# Patient Record
Sex: Male | Born: 1940 | Race: White | Hispanic: No | Smoking: Never smoker
Health system: Southern US, Community
[De-identification: ages and names within clinical notes are randomized; demographics above are authoritative.]

## PROBLEM LIST (undated history)

## (undated) DIAGNOSIS — R519 Headache, unspecified: Secondary | ICD-10-CM

## (undated) DIAGNOSIS — K219 Gastro-esophageal reflux disease without esophagitis: Secondary | ICD-10-CM

## (undated) DIAGNOSIS — D649 Anemia, unspecified: Secondary | ICD-10-CM

## (undated) DIAGNOSIS — I059 Rheumatic mitral valve disease, unspecified: Secondary | ICD-10-CM

## (undated) DIAGNOSIS — F0390 Unspecified dementia without behavioral disturbance: Secondary | ICD-10-CM

## (undated) DIAGNOSIS — I330 Acute and subacute infective endocarditis: Secondary | ICD-10-CM

## (undated) DIAGNOSIS — I514 Myocarditis, unspecified: Secondary | ICD-10-CM

## (undated) DIAGNOSIS — C4331 Malignant melanoma of nose: Secondary | ICD-10-CM

## (undated) DIAGNOSIS — Z982 Presence of cerebrospinal fluid drainage device: Secondary | ICD-10-CM

## (undated) DIAGNOSIS — I10 Essential (primary) hypertension: Secondary | ICD-10-CM

## (undated) DIAGNOSIS — G40909 Epilepsy, unspecified, not intractable, without status epilepticus: Secondary | ICD-10-CM

## (undated) DIAGNOSIS — R51 Headache: Secondary | ICD-10-CM

## (undated) DIAGNOSIS — M1990 Unspecified osteoarthritis, unspecified site: Secondary | ICD-10-CM

## (undated) DIAGNOSIS — S0291XA Unspecified fracture of skull, initial encounter for closed fracture: Secondary | ICD-10-CM

## (undated) DIAGNOSIS — E785 Hyperlipidemia, unspecified: Secondary | ICD-10-CM

## (undated) DIAGNOSIS — D332 Benign neoplasm of brain, unspecified: Secondary | ICD-10-CM

## (undated) HISTORY — PX: MELANOMA EXCISION: SHX5266

## (undated) HISTORY — DX: Unspecified dementia, unspecified severity, without behavioral disturbance, psychotic disturbance, mood disturbance, and anxiety: F03.90

## (undated) HISTORY — DX: Benign neoplasm of brain, unspecified: D33.2

## (undated) HISTORY — DX: Myocarditis, unspecified: I51.4

## (undated) HISTORY — DX: Anemia, unspecified: D64.9

## (undated) HISTORY — DX: Epilepsy, unspecified, not intractable, without status epilepticus: G40.909

## (undated) HISTORY — PX: BUNIONECTOMY: SHX129

## (undated) HISTORY — DX: Hyperlipidemia, unspecified: E78.5

## (undated) HISTORY — DX: Unspecified fracture of skull, initial encounter for closed fracture: S02.91XA

## (undated) HISTORY — PX: EYE SURGERY: SHX253

## (undated) HISTORY — PX: CARDIAC CATHETERIZATION: SHX172

## (undated) HISTORY — DX: Essential (primary) hypertension: I10

## (undated) HISTORY — DX: Gastro-esophageal reflux disease without esophagitis: K21.9

## (undated) HISTORY — DX: Acute and subacute infective endocarditis: I33.0

---
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
## 2006-04-08 ENCOUNTER — Other Ambulatory Visit: Payer: Self-pay

## 2006-04-08 ENCOUNTER — Inpatient Hospital Stay: Payer: Self-pay | Admitting: Internal Medicine

## 2007-03-22 ENCOUNTER — Other Ambulatory Visit: Payer: Self-pay

## 2007-03-22 ENCOUNTER — Ambulatory Visit: Payer: Self-pay | Admitting: Ophthalmology

## 2007-03-29 ENCOUNTER — Ambulatory Visit: Payer: Self-pay | Admitting: Ophthalmology

## 2013-08-14 ENCOUNTER — Ambulatory Visit: Payer: Self-pay | Admitting: Podiatry

## 2013-09-06 ENCOUNTER — Ambulatory Visit (INDEPENDENT_AMBULATORY_CARE_PROVIDER_SITE_OTHER): Payer: Medicare Other | Admitting: Podiatry

## 2013-09-06 ENCOUNTER — Encounter: Payer: Self-pay | Admitting: Podiatry

## 2013-09-06 VITALS — BP 114/65 | HR 73 | Resp 14 | Ht 63.0 in | Wt 118.0 lb

## 2013-09-06 DIAGNOSIS — M79609 Pain in unspecified limb: Secondary | ICD-10-CM

## 2013-09-06 DIAGNOSIS — B351 Tinea unguium: Secondary | ICD-10-CM

## 2013-09-06 NOTE — Progress Notes (Signed)
   Subjective:    Patient ID: Nicholas Hodges, male    DOB: 1940/11/20, 73 y.o.   MRN: [id number]  HPI Comments: He has a toenail that has grown over his other toes, right great toenail long thick and painful      Review of Systems  Constitutional: Positive for fatigue and unexpected weight change.  HENT:       Ringing in ears , sinus problems  Sneezing   Endocrine: Positive for cold intolerance.  Musculoskeletal:       Muscle pain   Allergic/Immunologic: Positive for food allergies.  Neurological: Positive for seizures and headaches.  Psychiatric/Behavioral: Positive for confusion.  All other systems reviewed and are negative.       Objective:   Physical Exam: I have reviewed his past medical history medications allergies surgeries social history. Pulses are strongly palpable bilateral neurologic sensorium is intact. Orthopedic evaluation Mr. is all joints distal to the ankle a full range of motion without crepitus mild HAV deformity and hammertoe deformities are noted bilateral but are asymptomatic. Cutaneous evaluation demonstrates supple well hydrated cutis nails are grossly neglected elongated and painful. He lost the nail to his fourth toe his right foot recently. There no signs of bacterial infection at this point.          Assessment & Plan:  Assessment: Pain in limb secondary to onychomycosis 1 through 5 bilateral.  Plan: Debridement of nails 1 through 5 bilateral covered service secondary to pain.

## 2015-01-09 ENCOUNTER — Ambulatory Visit: Payer: Medicare Other

## 2015-04-22 ENCOUNTER — Encounter: Payer: Self-pay | Admitting: Podiatry

## 2015-04-22 ENCOUNTER — Ambulatory Visit (INDEPENDENT_AMBULATORY_CARE_PROVIDER_SITE_OTHER): Payer: Medicare Other | Admitting: Podiatry

## 2015-04-22 VITALS — BP 140/76 | HR 67 | Resp 16

## 2015-04-22 DIAGNOSIS — M79676 Pain in unspecified toe(s): Secondary | ICD-10-CM | POA: Diagnosis not present

## 2015-04-22 DIAGNOSIS — B351 Tinea unguium: Secondary | ICD-10-CM | POA: Diagnosis not present

## 2015-04-22 NOTE — Progress Notes (Signed)
He presents today with chief complaint of painful elongated toenails bilateral. He states there is no longer hurt and I think about an ingrown toenail.  Objective: Vital signs are stable he is alert and oriented 3 pulses are palpable bilateral. His nails are grossly elongated with malodor no purulence and no signs of infection.  Assessment: Pain and limb secondary to onychomycosis 1 through 5 bilateral.  Plan: Debridement of nails 1 through 5 bilateral as a covered service secondary to pain. Follow-up with me in 3 months.  Roselind Messier DPM

## 2015-07-22 ENCOUNTER — Encounter (INDEPENDENT_AMBULATORY_CARE_PROVIDER_SITE_OTHER): Payer: Medicare Other | Admitting: Podiatry

## 2015-07-22 ENCOUNTER — Ambulatory Visit: Payer: Medicare Other

## 2015-07-22 ENCOUNTER — Encounter: Payer: Self-pay | Admitting: Podiatry

## 2015-07-22 DIAGNOSIS — M79676 Pain in unspecified toe(s): Secondary | ICD-10-CM

## 2015-07-22 DIAGNOSIS — B351 Tinea unguium: Secondary | ICD-10-CM

## 2015-07-22 NOTE — Progress Notes (Signed)
This encounter was created in error - please disregard.

## 2015-08-07 ENCOUNTER — Ambulatory Visit: Payer: Medicare Other | Admitting: Podiatry

## 2017-03-10 ENCOUNTER — Ambulatory Visit: Payer: Medicare Other

## 2017-03-10 ENCOUNTER — Ambulatory Visit (INDEPENDENT_AMBULATORY_CARE_PROVIDER_SITE_OTHER): Payer: Medicare Other | Admitting: Podiatry

## 2017-03-10 ENCOUNTER — Encounter: Payer: Self-pay | Admitting: Podiatry

## 2017-03-10 DIAGNOSIS — M79676 Pain in unspecified toe(s): Secondary | ICD-10-CM | POA: Diagnosis not present

## 2017-03-10 DIAGNOSIS — B351 Tinea unguium: Secondary | ICD-10-CM

## 2017-03-10 DIAGNOSIS — M109 Gout, unspecified: Secondary | ICD-10-CM

## 2017-03-10 DIAGNOSIS — S90212A Contusion of left great toe with damage to nail, initial encounter: Secondary | ICD-10-CM

## 2017-03-10 NOTE — Progress Notes (Signed)
He presents today with his daughter and grandson chief concern of a painful hallux nail right foot. Daughter states that this is been this way for quite some time seems to be getting worse his blood underneath it and he complains about hurting. 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  Objective: Pulses are palpable. Subungual hematoma hallux right with distal onycholysis. Does not appear to be infected. No erythema cellulitis drainage or odor.  Assessment: Paronychia subungual hematoma.  Plan: I debrided the nail for him today cleaned of all of the debris recommended Epsom salts soaks at least once a day for the next week this should resolve his symptoms.

## 2017-04-04 ENCOUNTER — Emergency Department (HOSPITAL_COMMUNITY)
Admission: EM | Admit: 2017-04-04 | Discharge: 2017-04-05 | Disposition: A | Discharge status: Home / Self Care | Payer: Medicare Other | Attending: Emergency Medicine | Admitting: Emergency Medicine

## 2017-04-04 ENCOUNTER — Emergency Department (HOSPITAL_COMMUNITY): Payer: Medicare Other

## 2017-04-04 ENCOUNTER — Encounter (HOSPITAL_COMMUNITY): Payer: Self-pay | Admitting: Emergency Medicine

## 2017-04-04 DIAGNOSIS — G309 Alzheimer's disease, unspecified: Secondary | ICD-10-CM | POA: Insufficient documentation

## 2017-04-04 DIAGNOSIS — Z79899 Other long term (current) drug therapy: Secondary | ICD-10-CM | POA: Diagnosis not present

## 2017-04-04 DIAGNOSIS — R4781 Slurred speech: Secondary | ICD-10-CM | POA: Diagnosis not present

## 2017-04-04 DIAGNOSIS — I10 Essential (primary) hypertension: Secondary | ICD-10-CM | POA: Insufficient documentation

## 2017-04-04 HISTORY — DX: Presence of cerebrospinal fluid drainage device: Z98.2

## 2017-04-04 LAB — CBC
HEMATOCRIT: 43.5 % (ref 39.0–52.0)
HEMOGLOBIN: 14.9 g/dL (ref 13.0–17.0)
MCH: 32.7 pg (ref 26.0–34.0)
MCHC: 34.3 g/dL (ref 30.0–36.0)
MCV: 95.4 fL (ref 78.0–100.0)
Platelets: 207 10*3/uL (ref 150–400)
RBC: 4.56 MIL/uL (ref 4.22–5.81)
RDW: 14.8 % (ref 11.5–15.5)
WBC: 8.6 10*3/uL (ref 4.0–10.5)

## 2017-04-04 LAB — BASIC METABOLIC PANEL
Anion gap: 6 (ref 5–15)
BUN: 15 mg/dL (ref 6–20)
CHLORIDE: 103 mmol/L (ref 101–111)
CO2: 23 mmol/L (ref 22–32)
Calcium: 8.4 mg/dL — ABNORMAL LOW (ref 8.9–10.3)
Creatinine, Ser: 0.82 mg/dL (ref 0.61–1.24)
GFR calc Af Amer: >60 mL/min (ref >60)
GFR calc non Af Amer: >60 mL/min (ref >60)
Glucose, Bld: 90 mg/dL (ref 65–99)
POTASSIUM: 4.2 mmol/L (ref 3.5–5.1)
SODIUM: 132 mmol/L — AB (ref 135–145)

## 2017-04-04 LAB — CBG MONITORING, ED: Glucose-Capillary: 85 mg/dL (ref 65–99)

## 2017-04-04 NOTE — ED Notes (Signed)
Family gave pt 100MG  Dilantin.

## 2017-04-04 NOTE — ED Notes (Signed)
Pt to radiology via stretcher on monitor

## 2017-04-04 NOTE — ED Notes (Signed)
Pt to CT via stretcher

## 2017-04-04 NOTE — ED Notes (Signed)
Pt returned from xray

## 2017-04-04 NOTE — ED Notes (Signed)
Pt returned from CT °

## 2017-04-04 NOTE — ED Triage Notes (Signed)
Pt transported from home by EMS for possible CVA, per family pt may have some increase in slurred speech but unsure of when change occurred.  Pt was at baseline for EMS, per family pt felt less tickleish on right side than normal. Per family pt may have reported some numbness on right foot @ 1830 Family reports urine may be darker than normal. Per EMS pt is total dependant but no WC or walker in home. Pt is alert to norm, afebrile, responds to stimuli per norm.

## 2017-04-04 NOTE — ED Provider Notes (Signed)
[facility] DEPT Provider Note   CSN: [id number] Arrival date & time: 04/04/17  2149     History   Chief Complaint Chief Complaint  Patient presents with  . Weakness    HPI Nicholas Hodges is a 76 y.o. male with h/o epilepsy, brain tumor s/p resection, Alzheimer's (reported by daughter) is brought to ED for evaluation of slurred speech, numbness to bilateral soles, numbness to left leg and disorientation.  Symptoms first noticed at [facility] by daughter. Daughter first noticed pt was first disoriented (could not remember her name), then he couldn't feel either of his soles and usually he is very ticklish whenever his feet are touch. He could not feel his left leg when getting a bath. Symptoms completed resolved by arrival to ED.   Lives with wife and daughter's family. Has 24/7 care. No recent falls, fevers, vomiting, diarrhea, changes in appetite or urine output. Has had wet sounding cough x couple of days. No h/o CVA or TIA.   HPI  Past Medical History:  Diagnosis Date  . Brain tumor ([place])   . Epilepsy ([place])   . GERD (gastroesophageal reflux disease)   . Hyperlipidemia   . Hypertension   . Myocarditis ([place])   . S/P VP shunt   . Subacute bacterial endocarditis     There are no active problems to display for this patient.   Past Surgical History:  Procedure Laterality Date  . BRAIN TUMOR EXCISION    . foot surgery         Home Medications    Prior to Admission medications   Medication Sig Start Date End Date Taking? Authorizing Provider  DILANTIN 100 MG ER capsule Take 100 mg by mouth 3 (three) times daily.  07/20/13  Yes [provider]    Family History No family history on file.  Social History Social History  Substance Use Topics  . Smoking status: Never Smoker  . Smokeless tobacco: Never Used  . Alcohol use No     Allergies   Patient has no known allergies.   Review of Systems Review of Systems  Unable to perform ROS: Dementia    Constitutional: Negative for fever.  Genitourinary: Negative for difficulty urinating.     Physical Exam Updated Vital Signs BP 94/61   Pulse 64   Temp 97.6 F (36.4 C) (Oral)   Resp 11   SpO2 97%   Physical Exam  Constitutional: He is oriented to person, place, and time. He appears well-developed and well-nourished. No distress.  Pleasantly confused, alert and oriented to self only. Recognizes her daughter.  HENT:  Head: Normocephalic and atraumatic.  Nose: Nose normal.  Mouth/Throat: Oropharynx is clear and moist. No oropharyngeal exudate.  Poor dentition Oropharynx and tonsils normal  Eyes: Pupils are equal, round, and reactive to light. Conjunctivae and EOM are normal.  Neck: Normal range of motion. Neck supple. No tracheal deviation present.  Cardiovascular: Normal rate, regular rhythm, normal heart sounds and intact distal pulses.   No murmur heard. Pulmonary/Chest: Effort normal and breath sounds normal. No respiratory distress. He has no wheezes. He has no rales.  Abdominal: Soft. Bowel sounds are normal. He exhibits no distension. There is no tenderness.  Musculoskeletal: Normal range of motion. He exhibits no deformity.  Neurological: He is alert and oriented to person, place, and time.  PERRL bilaterally, cannot follow command to check EOMs 5/5 strength bilateral grip 5/5 strength bilateral hip flexion Sensation to light touch intact to hand, legs and soles  No leg drift   Skin: Skin is warm and dry. Capillary refill takes less than 2 seconds.  Psychiatric: He has a normal mood and affect. His behavior is normal. Judgment and thought content normal.  Nursing note and vitals reviewed.    ED Treatments / Results  Labs (all labs ordered are listed, but only abnormal results are displayed) Labs Reviewed  BASIC METABOLIC PANEL - Abnormal; Notable for the following:       Result Value   Sodium 132 (*)    Calcium 8.4 (*)    All other components within normal  limits  PHENYTOIN LEVEL, TOTAL - Abnormal; Notable for the following:    Phenytoin Lvl 32.0 (*)    All other components within normal limits  CBC  URINALYSIS, ROUTINE W REFLEX MICROSCOPIC  CBG MONITORING, ED    EKG  EKG Interpretation  Date/Time:  Sunday April 04 2017 22:03:57 EDT Ventricular Rate:  66 PR Interval:    QRS Duration: 96 QT Interval:  441 QTC Calculation: 463 R Axis:   83 Text Interpretation:  Sinus rhythm Probable left atrial enlargement Borderline right axis deviation no significant change compared to 2008 Confirmed by Sherwood Gambler [phone]) on 04/04/2017 10:14:47 PM       Radiology Dg Chest 2 View  Result Date: 04/04/2017 CLINICAL DATA:  Cough EXAM: CHEST  2 VIEW COMPARISON:  None. FINDINGS: The heart size and mediastinal contours are within normal limits. Both lungs are clear. The visualized skeletal structures are unremarkable. IMPRESSION: No active cardiopulmonary disease. Electronically Signed   By: Ulyses Jarred M.D.   On: 04/04/2017 23:54   Ct Head Wo Contrast  Result Date: 04/04/2017 CLINICAL DATA:  Altered mental status EXAM: CT HEAD WITHOUT CONTRAST TECHNIQUE: Contiguous axial images were obtained from the base of the skull through the vertex without intravenous contrast. COMPARISON:  None. FINDINGS: Brain: There is a right frontal approach ventriculostomy catheter with tip in the temporal horn of the right lateral ventricle. There is severe atrophy with lateral ventriculomegaly. There is cerebellar encephalomalacia and dilated extra-axial space of the posterior fossa. No acute hemorrhage. No midline shift or other mass effect. Vascular: No hyperdense vessel or unexpected calcification. Skull: Remote suboccipital craniectomy. Sinuses/Orbits: No sinus fluid levels or advanced mucosal thickening. No mastoid effusion. Normal orbits. IMPRESSION: 1. Findings of remote posterior fossa tumor resection with severe cerebellar encephalomalacia. 2. Severe atrophy with  marked ventriculomegaly. Right frontal approach ventriculostomy shunt catheter terminates in the temporal horn of the right lateral ventricle. 3. No acute abnormality. Electronically Signed   By: Ulyses Jarred M.D.   On: 04/04/2017 23:25    Procedures Procedures (including critical care time)  Medications Ordered in ED Medications - No data to display   Initial Impression / Assessment and Plan / ED Course  I have reviewed the triage vital signs and the nursing notes.  Pertinent labs & imaging results that were available during my care of the patient were reviewed by me and considered in my medical decision making (see chart for details).  Clinical Course as of Apr 05 240  Mon Apr 05, 2017  0048 Spoke to Dr. Cheral Marker. He discussed many possible ddx. Dementia w Cognitive decline? Seizure? Drug toxicity? Infection? shunt malfunction? Symptoms don't sound convincing of TIA/CVA. Discussed risks vs benefits of admission for full work up or outpatient work up. Family felt strongly about doing outpatient work up with neurology.   [CG]  0204 Phenytoin Lvl: (!!) 32.0 [CG]    Clinical Course User  Index [CG] Kinnie Feil, PA-C   76 yo male with baseline dementia, VP shunt, seizures on dilantin, brain tumor s/p resection presents to ED for evaluation of disorientation, slurred speech, decreased sensation to bilateral soles and LLE onset 6PM. Symptoms have resolved. On exam, he is pleasantly confused and oriented to self. He recognizes his daughter. Sensation to light touch intact in soles, hands and legs intact. No focal weakness. Considering TIA vs CVA, infection, drug toxicity.   CBC, BMP, U/A, CBG, EKG and CXR unremarkable. Phenytoin level 32. Pt used to see neurology many years ago. Does not have a PCP. Has been on 300 mg dilantin TID for many years without recent checks or dose adjustments.   Discussed admission vs outpatient f/u with family. Discussed risks and benefits of each option with  family at bedside. They opted to be discharged today and complete work up as outpatient, they are aware of possible risks with this including life altering stroke and death. He does not sound like a good candidate for anticoagulation.  Discussed pt with Dr Cheral Marker. He is considering other etiologies, s/s not super convincing of TIA/CVA. He finds outpatient work up reasonable given pt's baseline mental status.  Patient, ED treatment and discharge plan was discussed with supervising physician who also evaluated the patient and is agreeable with plan.   Pt d/c with urgent referral to neuro ordered. Advised to hold dilantin for 2 days and get levels checked again then. Seizure precautions given. He does not have PCP or neurologist, he is aware if he is unable to establish care as outpatient or get levels checked he can return to ED,   Final Clinical Impressions(s) / ED Diagnoses   Final diagnoses:  Slurred speech    New Prescriptions New Prescriptions   No medications on file     Kinnie Feil, PA-C 04/05/17 0241    Kinnie Feil, PA-C 04/08/17 0175    Merryl Hacker, MD 04/11/17 2255

## 2017-04-05 DIAGNOSIS — R4781 Slurred speech: Secondary | ICD-10-CM | POA: Diagnosis not present

## 2017-04-05 LAB — URINALYSIS, ROUTINE W REFLEX MICROSCOPIC
Bilirubin Urine: NEGATIVE
GLUCOSE, UA: NEGATIVE mg/dL
HGB URINE DIPSTICK: NEGATIVE
KETONES UR: NEGATIVE mg/dL
Leukocytes, UA: NEGATIVE
Nitrite: NEGATIVE
PH: 6 (ref 5.0–8.0)
PROTEIN: NEGATIVE mg/dL
Specific Gravity, Urine: 1.018 (ref 1.005–1.030)

## 2017-04-05 LAB — PHENYTOIN LEVEL, TOTAL: Phenytoin Lvl: 32 ug/mL (ref 10.0–20.0)

## 2017-04-05 NOTE — ED Notes (Signed)
PA at bedside.

## 2017-04-05 NOTE — ED Notes (Signed)
Placed condom cath on pt 

## 2017-04-05 NOTE — Discharge Instructions (Signed)
Your dilantin levels are high. Please hold your dilantin for 2 days. You need to get your levels checked within two days maximum and your dose modified. If you cannot get your levels rechecked in 2 days by neurology or primary care provider return to emergency department so we can check your levels and re-start your dose. Stopping your dilantin medication for more than 2 days can predispose you to have a seizure , falls and more serious complications.  Neurology urgent referral has been placed. Contact neurology as soon as possible for further evaluation and full outpatient work up of your symptoms .

## 2017-04-06 ENCOUNTER — Other Ambulatory Visit: Payer: Medicare Other

## 2017-04-06 ENCOUNTER — Encounter: Payer: Self-pay | Admitting: Neurology

## 2017-04-06 ENCOUNTER — Ambulatory Visit (INDEPENDENT_AMBULATORY_CARE_PROVIDER_SITE_OTHER): Payer: Medicare Other | Admitting: Neurology

## 2017-04-06 VITALS — BP 126/68 | HR 78 | Ht 62.0 in | Wt 123.0 lb

## 2017-04-06 DIAGNOSIS — Z87898 Personal history of other specified conditions: Secondary | ICD-10-CM | POA: Diagnosis not present

## 2017-04-06 DIAGNOSIS — G40209 Localization-related (focal) (partial) symptomatic epilepsy and epileptic syndromes with complex partial seizures, not intractable, without status epilepticus: Secondary | ICD-10-CM

## 2017-04-06 DIAGNOSIS — F0390 Unspecified dementia without behavioral disturbance: Secondary | ICD-10-CM

## 2017-04-06 DIAGNOSIS — F03A0 Unspecified dementia, mild, without behavioral disturbance, psychotic disturbance, mood disturbance, and anxiety: Secondary | ICD-10-CM

## 2017-04-06 LAB — PHENYTOIN LEVEL, TOTAL: PHENYTOIN LVL: 23.5 mg/L — AB (ref 10.0–20.0)

## 2017-04-06 NOTE — Progress Notes (Signed)
NEUROLOGY CONSULTATION NOTE  Nicholas Hodges MRN: [id number] DOB: 15-Apr-1941  Referring provider: Dr. Thayer Jew (ER) Primary care provider: Threasa Alpha, FNP  Reason for consult:  Episode of slurred speech  Dear Dr Dina Rich:  Thank you for your kind referral of Nicholas Hodges for consultation of the above symptoms. Although his history is well known to you, please allow me to reiterate it for the purpose of our medical record. The patient was accompanied to the clinic by his wife and daughter who also provide collateral information. Records and images were personally reviewed where available.  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 He is total care at baseline, family helps him with dressing and bathing, and started noticing that he would be leaning forward and was more wobbly recently. His daughter bought him new shoes, and when she tried to put it on last 8/26, noticed that he was not reacting when his feet were touched, he would usually be very ticklish. His son-in-law put him in the tub for a bath, and he reported that he could not feel patchy areas on his left leg. Family also noticed his speech was slurred and he was more disoriented (could not remember her name). He was brought to the ER, and daughter reports by that time, he was back to baseline, not wanting staff to touch his feet. Family stays with him 24/7 and had not witnessed any seizure activity. In the ER, he was noted to have a supratherapeutic Dilantin level of 32. Records in the ER indicate he was on 300mg  TID, but his wife and daughter today confirm repeatedly that he has been on 100mg  TID Dilantin for many years with no change in dose, no new  medications added. His wife gives his medications. CBC, BMP were unremarkable except for mildly low sodium of 132. Urinalysis negative. I personally reviewed head CT without contrast which did not show any acute changes, there was severe atrophy with lateral ventriculomegaly, right frontal approach ventriculostomy catheter with tip in the temporal horn of the right lateral ventricle. 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 Family was instructed to hold Dilantin until Neurology follow-up.  His family reports he has been doing well since hospital discharge, with no further slurred speech. There is some delay in answering questions, but he appears to be able to answer appropriately when asked about dizziness ("some, not all the time"). His vision is "sometimes blurry, sometimes clear," and states it is blurry today. He denies any neck/back pain, no paresthesias. His wife reports that he continues to have seizures every 2 months or so, he would start staring, get stiff with twitching, with head turn to the left. Seizures last 1-2 minutes, he is drowsy after. He wears adult diapers but family reports he has incontinence with the seizures. This year he has had around 4 seizures. His wife denies any other prior AEDs. They started noticing memory changes for the past several years, he would forget little things, or put shoes on the wrong foot. Family now puts on his clothes and bathes him. He usually repeats himself. They report sleep and appetite are good. His older sister had dementia.   I personally reviewed  head CT without contrast done 04/04/17, no acute changes seen. There was a right frontal approach ventriculostomy catheter with tip in the temporal horn of the right lateral ventricle. There was severe atrophy with lateral ventriculomegaly, cerebellar encephalomalacia and dilated extra-axial space of the posterior fossa.    PAST MEDICAL  HISTORY: Past Medical History:  Diagnosis Date  . Brain tumor ([place])   . Epilepsy ([place])   . GERD (gastroesophageal reflux disease)   . Hyperlipidemia   . Hypertension   . Myocarditis ([place])   . S/P VP shunt   . Subacute bacterial endocarditis     PAST SURGICAL HISTORY: Past Surgical History:  Procedure Laterality Date  . BRAIN TUMOR EXCISION    . foot surgery      MEDICATIONS: Current Outpatient Prescriptions on File Prior to Visit  Medication Sig Dispense Refill  . DILANTIN 100 MG ER capsule Take 100 mg by mouth 3 (three) times daily.      No current facility-administered medications on file prior to visit.     ALLERGIES: No Known Allergies  FAMILY HISTORY: Family History  Problem Relation Age of Onset  . Aneurysm Mother   . Dementia Sister   . Stomach cancer Sister   . Colon cancer Sister     SOCIAL HISTORY: Social History   Social History  . Marital status: Married    Spouse name: N/A  . Number of children: N/A  . Years of education: N/A   Occupational History  . Not on file.   Social History Main Topics  . Smoking status: Never Smoker  . Smokeless tobacco: Never Used  . Alcohol use No  . Drug use: No  . Sexual activity: Not on file   Other Topics Concern  . Not on file   Social History Narrative  . No narrative on file    REVIEW OF SYSTEMS: Constitutional: No fevers, chills, or sweats, no generalized fatigue, change in appetite Eyes: No visual changes, double vision, eye pain Ear, nose and throat: No hearing loss, ear pain, nasal congestion, sore throat Cardiovascular: No chest pain, palpitations Respiratory:  No shortness of breath at rest or with exertion, wheezes GastrointestinaI: No nausea, vomiting, diarrhea, abdominal pain, fecal incontinence Genitourinary:  No dysuria, urinary retention or frequency Musculoskeletal:  No neck pain, back pain Integumentary: No rash, pruritus, skin lesions Neurological: as above Psychiatric: No  depression, insomnia, anxiety Endocrine: No palpitations, fatigue, diaphoresis, mood swings, change in appetite, change in weight, increased thirst Hematologic/Lymphatic:  No anemia, purpura, petechiae. Allergic/Immunologic: no itchy/runny eyes, nasal congestion, recent allergic reactions, rashes  PHYSICAL EXAM: Vitals:   04/06/17 0920  BP: 126/68  Pulse: 78  SpO2: 93%   General: No acute distress, wheelchair-bound Head:  Normocephalic Eyes: Fundoscopic exam shows bilateral sharp discs, no vessel changes, exudates, or hemorrhages Neck: supple, no paraspinal tenderness, full range of motion Back: No paraspinal tenderness Heart: regular rate and rhythm Lungs: Clear to auscultation bilaterally. Vascular: No carotid bruits. Skin/Extremities: No rash, no edema Neurological Exam: Mental status: alert and oriented to person, place, mild dysarthria, no aphasia, Fund of knowledge is reduced.  Recent and remote memory are impaired.  Attention and concentration are reduced, unable to spell WORLD. Unable to draw intersecting pentagons or clock (drew circle and wrote number 6). Able to name objects and repeat phrases.  Cranial nerves: CN I: not tested CN II: pupils equal, round and reactive to light, visual fields intact, fundi unremarkable. CN III, IV, VI:  full range of motion, no  nystagmus, no ptosis CN V: facial sensation intact CN VII: upper and lower face symmetric CN VIII: hearing intact to finger rub CN IX, X: gag intact, uvula midline CN XI: sternocleidomastoid and trapezius muscles intact CN XII: tongue midline Bulk & Tone: normal, no fasciculations. Motor: 5/5 throughout with no pronator drift. Sensation: intact to light touch, cold, pin, vibration and joint position sense.  No extinction to double simultaneous stimulation. Deep Tendon Reflexes: +2 throughout, no ankle clonus Plantar responses: downgoing bilaterally Cerebellar: mild left ataxia on finger to nose testing.  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 He has been on Dilantin 100mg  TID for many years with no recent changes in medications, his wife administers medications. A repeat Dilantin level will be checked today. Depending on level, we may either continue to hold or restart on a lower dose. He was still having seizures every few months on Dilantin, we discussed starting a new seizure medication, Lamotrigine, with future plans to very slowly taper off Dilantin once Lamotrigine is therapeutic. Side effects of Lamotrigine, including Kathreen Cosier syndrome, were discussed. Routine EEG will be ordered. His family is also concerned about dementia, he does have mild to moderate dementia, would hold off on starting cholinesterase inhibitors such as Donepezil at this time, we agreed to start one medication at a time. He will follow-up in 3 months and family knows to call for any changes.   Thank you for allowing me to participate in the care of this patient. Please do not hesitate to call for any questions or concerns.   Ellouise Newer, M.D.  CC: Threasa Alpha, FNP

## 2017-04-06 NOTE — Patient Instructions (Addendum)
1. Bloodwork for stat Dilantin level today  Your provider has requested that you have labwork completed today. Please go to [facility] ([phone]) on the second floor of this building before leaving the office today. You do not need to check in. If you are not called within 15 minutes please check with the front desk.   2. Schedule routine EEG 3. Our office will call you today with Dilantin results and instructions for medications 4. Follow-up in 3 months, call for any changes  Seizure Precautions: 1. If medication has been prescribed for you to prevent seizures, take it exactly as directed.  Do not stop taking the medicine without talking to your doctor first, even if you have not had a seizure in a long time.   2. Avoid activities in which a seizure would cause danger to yourself or to others.  Don't operate dangerous machinery, swim alone, or climb in high or dangerous places, such as on ladders, roofs, or girders.  Do not drive unless your doctor says you may.  3. If you have any warning that you may have a seizure, lay down in a safe place where you can't hurt yourself.    4.  No driving for 6 months from last seizure, as per [facility].   Please refer to the following link on the [place] website for more information: http://www.epilepsyfoundation.org/answerplace/Social/driving/drivingu.cfm   5.  Maintain good sleep hygiene. Avoid alcohol.  6.  Contact your doctor if you have any problems that may be related to the medicine you are taking.  7.  Call 911 and bring the patient back to the ED if:        A.  The seizure lasts longer than 5 minutes.       B.  The patient doesn't awaken shortly after the seizure  C.  The patient has new problems such as difficulty seeing, speaking or moving  D.  The patient was injured during the seizure  E.  The patient has a temperature over 102 F (39C)  F.  The patient vomited and now is having trouble  breathing

## 2017-04-07 ENCOUNTER — Other Ambulatory Visit: Payer: Self-pay | Admitting: Neurology

## 2017-04-07 ENCOUNTER — Other Ambulatory Visit: Payer: Self-pay

## 2017-04-07 ENCOUNTER — Telehealth: Payer: Self-pay | Admitting: Neurology

## 2017-04-07 DIAGNOSIS — R479 Unspecified speech disturbances: Secondary | ICD-10-CM

## 2017-04-07 DIAGNOSIS — G40219 Localization-related (focal) (partial) symptomatic epilepsy and epileptic syndromes with complex partial seizures, intractable, without status epilepticus: Secondary | ICD-10-CM

## 2017-04-07 DIAGNOSIS — G40209 Localization-related (focal) (partial) symptomatic epilepsy and epileptic syndromes with complex partial seizures, not intractable, without status epilepticus: Secondary | ICD-10-CM

## 2017-04-07 MED ORDER — LAMOTRIGINE 25 MG PO TABS: ORAL_TABLET | ORAL | 6 refills | Status: DC

## 2017-04-07 MED ORDER — PHENYTOIN SODIUM EXTENDED 100 MG PO CAPS: ORAL_CAPSULE | ORAL | 6 refills | Status: DC

## 2017-04-07 NOTE — Telephone Encounter (Signed)
Received results of STAT Dilantin level on 8/28 at 10:16PM (lab was ordered at [facility] on 8/28). Results were discussed with his daughter, Dilantin level has come down from 32 to 23, restart Dilantin tonight at lower dose 100mg  BID. Start Lamotrigine 25mg  BID x 2 weeks, then increase to 50mg  BID. Side effects, including Kathreen Cosier syndrome were discussed. F/u in 3 months as scheduled, call for any changes.   Meagen, let's order another Dilantin level for Friday 8/30, they have a [facility] in [place] per daughter. Thanks

## 2017-04-07 NOTE — Addendum Note (Signed)
Addended by: Lenny Pastel on: 04/07/2017 04:24 PM   Modules accepted: Orders

## 2017-04-08 ENCOUNTER — Telehealth: Payer: Self-pay | Admitting: Neurology

## 2017-04-08 ENCOUNTER — Ambulatory Visit (INDEPENDENT_AMBULATORY_CARE_PROVIDER_SITE_OTHER): Payer: Medicare Other | Admitting: Neurology

## 2017-04-08 DIAGNOSIS — Z87898 Personal history of other specified conditions: Secondary | ICD-10-CM

## 2017-04-08 DIAGNOSIS — G40209 Localization-related (focal) (partial) symptomatic epilepsy and epileptic syndromes with complex partial seizures, not intractable, without status epilepticus: Secondary | ICD-10-CM | POA: Diagnosis not present

## 2017-04-08 NOTE — Telephone Encounter (Signed)
Office # [phone]

## 2017-04-08 NOTE — Telephone Encounter (Signed)
Patient was seen in the office today for an EEG. He also was

## 2017-04-08 NOTE — Telephone Encounter (Signed)
Patient was in the office today for an EEG. He also was seen on 04/06/17 Tuesday. He had labs taken by [facility]. His daughter wanted to know could that blood work also be sent to [facility] Dr. Threasa Alpha? They were unable to draw blood from him when he was recently in their office. Please Advise. Thanks

## 2017-04-09 NOTE — Telephone Encounter (Signed)
Notes faxed via EPIC  

## 2017-04-09 NOTE — Procedures (Signed)
ELECTROENCEPHALOGRAM REPORT  Date of Study: 04/08/2017  Patient's Name: Nicholas Hodges MRN: [id number] Date of Birth: 1941/01/05  Referring Provider: Dr. Ellouise Newer  Clinical History: This is a 76 year old man with history of brain tumor s/p VP shunt, seizures. EEG for classification.  Medications: Dilantin  Technical Summary: A multichannel digital EEG recording measured by the international 10-20 system with electrodes applied with paste and impedances below 5000 ohms performed as portable with EKG monitoring in an awake and asleep patient.  Hyperventilation was not performed. Photic stimulation was performed.  The digital EEG was referentially recorded, reformatted, and digitally filtered in a variety of bipolar and referential montages for optimal display.   Description: The patient is awake and asleep during the recording.  During maximal wakefulness, there is a symmetric, medium voltage 7 Hz posterior dominant rhythm that attenuates with eye opening. This is admixed with a moderate amount of diffuse 4-5 Hz theta and occasional diffuse 2-3 Hz delta slowing of the waking background. There is additional frequent focal 2 Hz delta slowing seen over the right centroparietal region. During drowsiness and sleep, there is an increase in theta and delta slowing of the background. Normal sleep architecture is not seen. Photic stimulation did not elicit any abnormalities.  There were no epileptiform discharges or electrographic seizures seen.    EKG lead was unremarkable.  Impression: This  awake and asleep EEG is abnormal due to the presence of: 1. Mild to moderate diffuse slowing of the waking background 2. Additional focal slowing over the right centroparietal region  Clinical Correlation of the above findings indicates diffuse cerebral dysfunction that is non-specific in etiology and can be seen with hypoxic/ischemic injury, toxic/metabolic encephalopathies, neurodegenerative disorders, or  medication effect. Focal slowing over the right centroparietal region indicates focal cerebral dysfunction in this region suggestive of underlying structural or physiologic abnormality. The absence of epileptiform discharges does not rule out a clinical diagnosis of epilepsy.  Clinical correlation is advised.   Ellouise Newer, M.D.

## 2017-04-13 ENCOUNTER — Telehealth: Payer: Self-pay

## 2017-04-13 DIAGNOSIS — G40209 Localization-related (focal) (partial) symptomatic epilepsy and epileptic syndromes with complex partial seizures, not intractable, without status epilepticus: Secondary | ICD-10-CM | POA: Insufficient documentation

## 2017-04-13 DIAGNOSIS — Z87898 Personal history of other specified conditions: Secondary | ICD-10-CM | POA: Insufficient documentation

## 2017-04-13 NOTE — Telephone Encounter (Signed)
Called and spoke with pt's son-in-law.  He states that pt did not go to the hospital over the weekend, but is doing better.  He has not had an episode since daughter called in.

## 2017-05-14 ENCOUNTER — Encounter: Payer: Self-pay | Admitting: Neurology

## 2017-05-14 ENCOUNTER — Other Ambulatory Visit: Payer: Medicare Other

## 2017-05-14 ENCOUNTER — Ambulatory Visit (INDEPENDENT_AMBULATORY_CARE_PROVIDER_SITE_OTHER): Admitting: Neurology

## 2017-05-14 VITALS — BP 90/48 | HR 78 | Ht 66.0 in | Wt 123.0 lb

## 2017-05-14 DIAGNOSIS — R410 Disorientation, unspecified: Secondary | ICD-10-CM

## 2017-05-14 DIAGNOSIS — Z87898 Personal history of other specified conditions: Secondary | ICD-10-CM

## 2017-05-14 DIAGNOSIS — G40209 Localization-related (focal) (partial) symptomatic epilepsy and epileptic syndromes with complex partial seizures, not intractable, without status epilepticus: Secondary | ICD-10-CM

## 2017-05-14 LAB — AMMONIA: AMMONIA: 58 umol/L — AB (ref 11–35)

## 2017-05-14 NOTE — Progress Notes (Signed)
NEUROLOGY FOLLOW UP OFFICE NOTE  [name] [id number] 12/05/1940  HISTORY OF PRESENT ILLNESS: I had the pleasure of seeing Nicholas Hodges in follow-up in the neurology clinic on 05/14/2017.  The patient was last seen 6 weeks ago for supratherapeutic Dilantin level when he presented with to the ER with transient slurred speech and left-sided numbness on 04/04/17. Dilantin level was 32. His wife was reporting focal seizures every 2 months or so with staring, stiffness/twitching, head turn to the left. Repeat Dilantin level was 23.5. He had an EEG showing mild to moderate diffuse background slowing, with additional focal slowing over the right centroparietal region. His head CT from 03/2017 showed right ventriculostomy, severe atrophy with ventriculomegaly, cerebellar encephalomalacia. Family was instructed to reduce Dilantin to 100mg  BID and start Lamotrigine. He is currently 50mg  BID since 04/19/17. His daughter reports that they had been giving him Dilantin 100mg  once a day. Over the past 1.5 weeks, he had a significant change in mental status. They saw his PCP yesterday who felt that he may be overmedicated due to Dilantin and Lamotrigine combination. Over the past 1.5 weeks, his family reports that he just wants to lay in bed and does not want to do anything anymore. He used to be able to feed himself, but now his wife feeds him. He has to be carried to the commode. They have noticed a decrease in his urine output, previously they had to change his sheets a lot, but now the adult diapers are less wet. They have not noticed any change in urine color or smell. His BP has been running low. No fever per family. He also states he is dizzy when family tries to stand or sit him up. They have not witnessed any of his typical seizures with body stiffening. He has been hallucinating, speaking very loudly looking at the ceiling or asking what is on the floor. He sees a little boy in a white hat or that someone bit his  head. He told his wife he was dying. He has not been complaining of any headaches, no nausea/vomiting. When he eats, appetite is good but he has more difficulties swallowing. He is noted to have some right leg jerking in the office, his daughter reports he has always done that.   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 He is total care at baseline, family helps him with dressing and bathing, and started noticing that he would be leaning forward and was more wobbly recently. His daughter bought him new shoes, and when she tried to put it on last 8/26, noticed that he was not reacting when his feet were touched, he would usually be very ticklish. His son-in-law put him in the tub for a bath, and he reported that he could not feel patchy areas on his left leg. Family also noticed his speech was slurred and he was more disoriented (could not remember her name). He was brought to the ER, and daughter reports by that time, he was back to baseline, not wanting staff to touch his feet. Family stays with him 24/7 and had not witnessed any seizure activity. In the ER, he was noted to have a supratherapeutic Dilantin level of 32. Records in the ER indicate  he was on 300mg  TID, but his wife and daughter today confirm repeatedly that he has been on 100mg  TID Dilantin for many years with no change in dose, no new medications added. His wife gives his medications. CBC, BMP were unremarkable except for mildly low sodium of 132. Urinalysis negative. I personally reviewed head CT without contrast which did not show any acute changes, there was severe atrophy with lateral ventriculomegaly, right frontal approach ventriculostomy catheter with tip in the temporal horn of the right lateral  ventricle. 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 Family was instructed to hold Dilantin until Neurology follow-up.  His family reports he has been doing well since hospital discharge, with no further slurred speech. There is some delay in answering questions, but he appears to be able to answer appropriately when asked about dizziness ("some, not all the time"). His vision is "sometimes blurry, sometimes clear," and states it is blurry today. He denies any neck/back pain, no paresthesias. His wife reports that he continues to have seizures every 2 months or so, he would start staring, get stiff with twitching, with head turn to the left. Seizures last 1-2 minutes, he is drowsy after. He wears adult diapers but family reports he has incontinence with the seizures. This year he has had around 4 seizures. His wife denies any other prior AEDs. They started noticing memory changes for the past several years, he would forget little things, or put shoes on the wrong foot. Family now puts on his clothes and bathes him. He usually repeats himself. They report sleep and appetite are good. His older sister had dementia.   I personally reviewed head CT without contrast done 04/04/17, no acute changes seen. There was a right frontal approach ventriculostomy catheter with tip in the temporal horn of the right lateral ventricle. There was severe atrophy with lateral ventriculomegaly, cerebellar encephalomalacia and dilated extra-axial space of the posterior fossa.   PAST MEDICAL HISTORY: Past Medical History:  Diagnosis Date  . Brain tumor ([place])   . Epilepsy ([place])   . GERD (gastroesophageal reflux disease)   . Hyperlipidemia   . Hypertension   . Myocarditis ([place])   . S/P VP shunt   . Subacute bacterial endocarditis     MEDICATIONS: Current Outpatient Prescriptions on File Prior to Visit  Medication Sig Dispense Refill  . aspirin  EC 81 MG tablet Take 81 mg by mouth daily.    . Calcium Lactate 648 MG TABS Take by mouth.    Marland Kitchen ibuprofen (ADVIL,MOTRIN) 800 MG tablet Take 800 mg by mouth every 8 (eight) hours as needed.    . lamoTRIgine (LAMICTAL) 25 MG tablet Take 1 tablet twice a day for 2 weeks, then increase to 2 tablets twice a day 120 tablet 6  . omeprazole (PRILOSEC) 20 MG capsule     . phenytoin (DILANTIN) 100 MG ER capsule Take 1 capsule twice a day (Taking 1 capsule in the morning) 60 capsule 6  . Riboflavin (B-2) 100 MG TABS Take by mouth.     No current facility-administered medications on file prior to visit.     ALLERGIES: No Known Allergies  FAMILY HISTORY: Family History  Problem Relation Age of Onset  . Aneurysm Mother   . Dementia Sister   . Stomach cancer Sister   . Colon cancer Sister     SOCIAL HISTORY: Social History   Social History  . Marital status: Married    Spouse name: N/A  .  Number of children: N/A  . Years of education: N/A   Occupational History  . Not on file.   Social History Main Topics  . Smoking status: Never Smoker  . Smokeless tobacco: Never Used  . Alcohol use No  . Drug use: No  . Sexual activity: Not on file   Other Topics Concern  . Not on file   Social History Narrative   Pt and wife live with daughter and her family (spouse and 2 adult children) in 2 story home   Highest level of education; 8th grade   Retired Administrator, sports for [employer]   Has 2 children       REVIEW OF SYSTEMS: unable to obtain due to mental status, answers no to questions  PHYSICAL EXAM: Vitals:   05/14/17 1529  BP: (!) 90/48  Pulse: 78  SpO2: 91%   General: No acute distress, sitting on wheelchair appearing confused Head:  Normocephalic/atraumatic Neck: supple, no paraspinal tenderness, full range of motion Heart:  Regular rate and rhythm Lungs:  Clear to auscultation bilaterally Back: No paraspinal tenderness Skin/Extremities: No rash, no  edema Neurological Exam: alert and oriented to person. He intermittently follows commands but needs repeat instructions, later on showed 2 fingers when asked. He has poor eye contact, looking around the room, no gaze deviation noted. Cranial nerves: Pupils equal, round, reactive to light.  Extraocular movements intact with no nystagmus. Visual fields full. No facial asymmetry, edentulous. Tongue, uvula, palate midline.  Motor: Bulk and tone normal, no cogwheeling, moves all extremities symmetrically but unable to do formal muscle testing due to mental status. Withdraws to pain bilaterally. Reaches for objects with no clear ataxia seen. Gait not tested.   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 His wife continued to report seizures every few months while on the Dilantin, we agreed to start Lamotrigine with plans to taper off Dilantin. His last Dilantin level on 8/28 was 23.5. His family had reduced Dilantin to 100mg  daily, he is on Lamotrigine 50mg  BID. Over the past 1.5 weeks, there has been a significant change in mental status, he is noted to be confused today, no focal weakness, nystagmus, or clear ataxia that would typically be seen if he is Dilantin toxic. Etiology of delirium is unclear, his family reports a decrease in urine output and lower BP as well, BP today 90/48. I discussed bringing him to the ER with his family, but they would like to try to do everything outpatient and if symptoms worsen, bring him to ER. Check CBC, CMP, Dilantin and Lamotrigine levels. He may need a urinalysis if bloodwork unrevealing. We will also do an EEG to assess for subclinical seizures. Family knows to bring him to the ER if symptoms worsen. He will follow-up as scheduled next month.   Thank you for allowing  me to participate in his care.  Please do not hesitate to call for any questions or concerns.  The duration of this appointment visit was 25 minutes of face-to-face time with the patient.  Greater than 50% of this time was spent in counseling, explanation of diagnosis, planning of further management, and coordination of care.   Ellouise Newer, M.D.   CC: Threasa Alpha, FNP

## 2017-05-14 NOTE — Patient Instructions (Addendum)
1. Bloodwork for CBC, CMP, Dilantin level, Lamictal level, TSH, B12, ammonia  Your provider has requested that you have labwork completed today. Please go to [facility] ([address]) on the second floor of this building before leaving the office today. You do not need to check in. If you are not called within 15 minutes please check with the front desk.   2. If symptoms worsen this weekend, go to ER 3. If bloodwork fine, we will plan for a repeat EEG on Monday 4. Continue current medications 5. Follow-up as scheduled next month  Seizure Precautions: 1. If medication has been prescribed for you to prevent seizures, take it exactly as directed.  Do not stop taking the medicine without talking to your doctor first, even if you have not had a seizure in a long time.   2. Avoid activities in which a seizure would cause danger to yourself or to others.  Don't operate dangerous machinery, swim alone, or climb in high or dangerous places, such as on ladders, roofs, or girders.  Do not drive unless your doctor says you may.  3. If you have any warning that you may have a seizure, lay down in a safe place where you can't hurt yourself.    4.  No driving for 6 months from last seizure, as per [facility].   Please refer to the following link on the [place] website for more information: http://www.epilepsyfoundation.org/answerplace/Social/driving/drivingu.cfm   5.  Maintain good sleep hygiene. Avoid alcohol.  6.  Contact your doctor if you have any problems that may be related to the medicine you are taking.  7.  Call 911 and bring the patient back to the ED if:        A.  The seizure lasts longer than 5 minutes.       B.  The patient doesn't awaken shortly after the seizure  C.  The patient has new problems such as difficulty seeing, speaking or moving  D.  The patient was injured during the seizure  E.  The patient has a temperature over 102 F  (39C)  F.  The patient vomited and now is having trouble breathing

## 2017-05-17 ENCOUNTER — Encounter: Payer: Self-pay | Admitting: Neurology

## 2017-05-17 ENCOUNTER — Telehealth: Payer: Self-pay | Admitting: Neurology

## 2017-05-17 ENCOUNTER — Other Ambulatory Visit: Payer: Self-pay | Admitting: Neurology

## 2017-05-17 ENCOUNTER — Emergency Department (HOSPITAL_COMMUNITY)

## 2017-05-17 ENCOUNTER — Encounter (HOSPITAL_COMMUNITY): Payer: Self-pay | Admitting: Emergency Medicine

## 2017-05-17 ENCOUNTER — Emergency Department (HOSPITAL_COMMUNITY)
Admission: EM | Admit: 2017-05-17 | Discharge: 2017-05-18 | Disposition: A | Discharge status: Home / Self Care | Attending: Emergency Medicine | Admitting: Emergency Medicine

## 2017-05-17 ENCOUNTER — Ambulatory Visit (INDEPENDENT_AMBULATORY_CARE_PROVIDER_SITE_OTHER): Admitting: Neurology

## 2017-05-17 DIAGNOSIS — R410 Disorientation, unspecified: Secondary | ICD-10-CM

## 2017-05-17 DIAGNOSIS — Z87898 Personal history of other specified conditions: Secondary | ICD-10-CM

## 2017-05-17 DIAGNOSIS — G40909 Epilepsy, unspecified, not intractable, without status epilepticus: Secondary | ICD-10-CM | POA: Diagnosis not present

## 2017-05-17 DIAGNOSIS — G40209 Localization-related (focal) (partial) symptomatic epilepsy and epileptic syndromes with complex partial seizures, not intractable, without status epilepticus: Secondary | ICD-10-CM

## 2017-05-17 DIAGNOSIS — T420X1A Poisoning by hydantoin derivatives, accidental (unintentional), initial encounter: Secondary | ICD-10-CM | POA: Insufficient documentation

## 2017-05-17 DIAGNOSIS — Z7982 Long term (current) use of aspirin: Secondary | ICD-10-CM | POA: Diagnosis not present

## 2017-05-17 DIAGNOSIS — F0390 Unspecified dementia without behavioral disturbance: Secondary | ICD-10-CM | POA: Diagnosis not present

## 2017-05-17 DIAGNOSIS — Z79899 Other long term (current) drug therapy: Secondary | ICD-10-CM | POA: Insufficient documentation

## 2017-05-17 DIAGNOSIS — Z85841 Personal history of malignant neoplasm of brain: Secondary | ICD-10-CM | POA: Insufficient documentation

## 2017-05-17 DIAGNOSIS — R4182 Altered mental status, unspecified: Secondary | ICD-10-CM | POA: Diagnosis present

## 2017-05-17 LAB — CBC
HCT: 43.6 % (ref 39.0–52.0)
Hemoglobin: 14.5 g/dL (ref 13.0–17.0)
MCH: 32.1 pg (ref 26.0–34.0)
MCHC: 33.3 g/dL (ref 30.0–36.0)
MCV: 96.5 fL (ref 78.0–100.0)
PLATELETS: 317 10*3/uL (ref 150–400)
RBC: 4.52 MIL/uL (ref 4.22–5.81)
RDW: 14.2 % (ref 11.5–15.5)
WBC: 6.8 10*3/uL (ref 4.0–10.5)

## 2017-05-17 LAB — BASIC METABOLIC PANEL
Anion gap: 8 (ref 5–15)
BUN: 13 mg/dL (ref 6–20)
CHLORIDE: 102 mmol/L (ref 101–111)
CO2: 29 mmol/L (ref 22–32)
CREATININE: 0.82 mg/dL (ref 0.61–1.24)
Calcium: 9.1 mg/dL (ref 8.9–10.3)
GFR calc non Af Amer: >60 mL/min (ref >60)
Glucose, Bld: 119 mg/dL — ABNORMAL HIGH (ref 65–99)
Potassium: 3.8 mmol/L (ref 3.5–5.1)
Sodium: 139 mmol/L (ref 135–145)

## 2017-05-17 LAB — CBG MONITORING, ED: Glucose-Capillary: 114 mg/dL — ABNORMAL HIGH (ref 65–99)

## 2017-05-17 LAB — PHENYTOIN LEVEL, TOTAL: PHENYTOIN LVL: 46.2 ug/mL — AB (ref 10.0–20.0)

## 2017-05-17 LAB — I-STAT CG4 LACTIC ACID, ED: Lactic Acid, Venous: 1.89 mmol/L (ref 0.5–1.9)

## 2017-05-17 MED ORDER — LAMOTRIGINE 100 MG PO TABS: 100.0000 mg | ORAL_TABLET | Freq: Two times a day (BID) | ORAL | 6 refills | Status: DC

## 2017-05-17 MED ORDER — SODIUM CHLORIDE 0.9 % IV BOLUS (SEPSIS)
1000.0000 mL | Freq: Once | INTRAVENOUS | Status: AC
  Administered 2017-05-17: 1000 mL via INTRAVENOUS

## 2017-05-17 NOTE — Procedures (Addendum)
ELECTROENCEPHALOGRAM REPORT  Date of Study: 05/17/2017  Patient's Name: Nicholas Hodges MRN: [id number] Date of Birth: 18-Mar-1941  Referring Provider: Dr. Ellouise Newer  Clinical History: This is a 76 year old man with altered mental status for the past 1.5 weeks.  Medications: LAMICTAL 50mg  BID DILANTIN 100 MG daily aspirin EC 81 MG   Calcium Lactate 648 MG     PRILOSEC 20 MG capsule   Riboflavin (B-2) 100 MG TABS  Technical Summary: A multichannel digital 1-hour EEG recording measured by the international 10-20 system with electrodes applied with paste and impedances below 5000 ohms performed as portable with EKG monitoring in an awake and drowsy patient.  Hyperventilation and photic stimulation were not performed.  The digital EEG was referentially recorded, reformatted, and digitally filtered in a variety of bipolar and referential montages for optimal display.   Description: The patient is awake and drowsy during the recording. He is noted to be confused. The background is disorganized with a 7 Hz posterior dominant rhythm that poorly attenuates to eye opening and eye closure. This is admixed with a moderate amount of diffuse 4-5 Hz theta and 2-3 Hz delta background slowing. During drowsiness and sleep, there is an increase in theta and delta slowing of the background. Normal sleep architecture is not seen. Hyperventilation and photic stimulation were not performed. There were frequent right mid-temporal epileptiform discharges seen. These did not evolve in frequency or amplitude during the 1-hour study. There were no electrographic seizures seen.    EKG lead was unremarkable.  Impression: This 1-hour awake and drowsy EEG is abnormal due to the presence of: 1. Moderate diffuse slowing of the waking background 2. Frequent right mid-temporal epileptiform discharges  Clinical Correlation of the above findings indicates diffuse cerebral dysfunction that is non-specific in etiology  and can be seen with hypoxic/ischemic injury, toxic/metabolic encephalopathies, neurodegenerative disorders, or medication effect.  There is a possible tendency for seizures to arise from the right mid-temporal region. There were no electrographic seizures in this study. Clinical correlation is advised.   Ellouise Newer, M.D.

## 2017-05-17 NOTE — Telephone Encounter (Signed)
Pt's daughter Wilburn Cornelia called and has a question about medication

## 2017-05-17 NOTE — ED Notes (Signed)
Patient transported to CT 

## 2017-05-17 NOTE — ED Triage Notes (Signed)
Pt arrives via POV from home with daughter who states pt sent here for further eval of new onset mental status changes. Pt at baseline with dementia. Per daughter now unable to ambulate, urine outpt decreased, seems disoriented and confused more than the usual. Also had EEG done today which showed new seizure activity.

## 2017-05-17 NOTE — ED Provider Notes (Addendum)
[facility] DEPT Provider Note   CSN: [id number] Arrival date & time: 05/17/17  1521     History   Chief Complaint Chief Complaint  Patient presents with  . Altered Mental Status  . Weakness    HPI Nicholas Hodges is a 76 y.o. male.  HPI Patient has a history of dementia. He also has a history of prior brain tumor and epilepsy. Weeks he's had a decline in his mental status. He has been more disoriented and confused. He denies any trouble with vomiting, fevers or chills. He has not been eating or drinking as much and they're concerned that he might be dehydrated. Patient went and saw his neurologist today. He had an EEG that showed diffuse slowing and frequent right temporal epileptiform discharges. His neurologist recommended he come to the emergency room for further evaluation of his confusion. Per the family, her neurologist requested a CT scan as well as rechecking his Dilantin level.  Past Medical History:  Diagnosis Date  . Brain tumor ([place])   . Epilepsy ([place])   . GERD (gastroesophageal reflux disease)   . Hyperlipidemia   . Hypertension   . Myocarditis ([place])   . S/P VP shunt   . Subacute bacterial endocarditis     Patient Active Problem List   Diagnosis Date Noted  . Localization-related (focal) (partial) symptomatic epilepsy and epileptic syndromes with complex partial seizures, not intractable, without status epilepticus ([place]) 04/13/2017  . History of brain tumor 04/13/2017    Past Surgical History:  Procedure Laterality Date  . BRAIN TUMOR EXCISION    . foot surgery         Home Medications    Prior to Admission medications   Medication Sig Start Date End Date Taking? Authorizing Provider  aspirin EC 81 MG tablet Take 81 mg by mouth daily.   Yes [provider]  Calcium Lactate 648 MG TABS Take 648 mg by mouth at bedtime.    Yes [provider]  ibuprofen (ADVIL,MOTRIN) 800 MG tablet Take 800 mg by mouth every 8 (eight) hours as needed  (pain).    Yes [provider]  lamoTRIgine (LAMICTAL) 100 MG tablet Take 1 tablet (100 mg total) by mouth 2 (two) times daily. 05/17/17  Yes Cameron Sprang, MD  Melatonin 10 MG TABS Take 10 mg by mouth at bedtime.   Yes [provider]  omeprazole (PRILOSEC) 20 MG capsule Take 20 mg by mouth daily.  04/05/17  Yes [provider]  Riboflavin (B-2) 100 MG TABS Take 100 mg by mouth daily.    Yes [provider]    Family History Family History  Problem Relation Age of Onset  . Aneurysm Mother   . Dementia Sister   . Stomach cancer Sister   . Colon cancer Sister     Social History Social History  Substance Use Topics  . Smoking status: Never Smoker  . Smokeless tobacco: Never Used  . Alcohol use No     Allergies   Patient has no known allergies.   Review of Systems Review of Systems  All other systems reviewed and are negative.    Physical Exam Updated Vital Signs BP 107/69   Pulse 63   Temp 98.2 F (36.8 C) (Oral)   Resp 14   SpO2 97%   Physical Exam  Constitutional: No distress.  HENT:  Head: Normocephalic and atraumatic.  Right Ear: External ear normal.  Left Ear: External ear normal.  Eyes: Conjunctivae are normal.  Right eye exhibits no discharge. Left eye exhibits no discharge. No scleral icterus.  Neck: Neck supple. No tracheal deviation present.  Cardiovascular: Normal rate, regular rhythm and intact distal pulses.   Pulmonary/Chest: Effort normal and breath sounds normal. No stridor. No respiratory distress. He has no wheezes. He has no rales.  Abdominal: Soft. Bowel sounds are normal. He exhibits no distension. There is no tenderness. There is no rebound and no guarding.  Musculoskeletal: He exhibits no edema or tenderness.  Neurological: He is alert. No sensory deficit. He exhibits normal muscle tone. He displays no seizure activity. Coordination abnormal.  Confused, agitated, consolable by daughter, moves all  extremities,   Skin: Skin is warm and dry. No rash noted. He is not diaphoretic.  Psychiatric: He has a normal mood and affect.  Nursing note and vitals reviewed.    ED Treatments / Results  Labs (all labs ordered are listed, but only abnormal results are displayed) Labs Reviewed  BASIC METABOLIC PANEL - Abnormal; Notable for the following:       Result Value   Glucose, Bld 119 (*)    All other components within normal limits  PHENYTOIN LEVEL, TOTAL - Abnormal; Notable for the following:    Phenytoin Lvl 46.2 (*)    All other components within normal limits  CBG MONITORING, ED - Abnormal; Notable for the following:    Glucose-Capillary 114 (*)    All other components within normal limits  CBC  URINALYSIS, ROUTINE W REFLEX MICROSCOPIC  I-STAT CG4 LACTIC ACID, ED    EKG  EKG Interpretation  Date/Time:  Monday May 17 2017 20:58:32 EDT Ventricular Rate:  66 PR Interval:    QRS Duration: 93 QT Interval:  431 QTC Calculation: 452 R Axis:   75 Text Interpretation:  Sinus rhythm No significant change since last tracing Confirmed by Dorie Rank [phone]) on 05/17/2017 9:25:36 PM       Radiology Ct Head Wo Contrast  Result Date: 05/17/2017 CLINICAL DATA:  Unexplained altered level of consciousness ; history of prior brain tumor resection, seizure disorder, VP shunting, hypertension EXAM: CT HEAD WITHOUT CONTRAST TECHNIQUE: Contiguous axial images were obtained from the base of the skull through the vertex without intravenous contrast. Sagittal and coronal MPR images reconstructed from axial data set. COMPARISON:  04/04/2017 FINDINGS: Brain: Scattered motion artifacts limit exam. Prior occipital craniotomy and resection at the cerebellum with a large area of encephalomalacia at the medial aspects the cerebellar hemispheres and at the vermis. Prior RIGHT temporal craniotomy with encephalomalacia at the anterior RIGHT temporal lobe. Generalized atrophy. Chronic ventriculomegaly,  stable. VP shunt via posterior RIGHT parietal approach with tip at temporal horn of RIGHT lateral ventricle. Additional shunt at surgical bed in the posterior fossa. No midline shift or mass effect. No gross evidence of hemorrhage, mass or acute infarction. Vascular: Unremarkable Skull: Postsurgical changes as above Sinuses/Orbits: Grossly clear within limitations of motion Other: N/A IMPRESSION: Postsurgical changes from occipital and RIGHT temporal craniotomies with areas of resection and encephalomalacia at the anterior RIGHT temporal lobe and at the posterior fossa. Generalized atrophy with stable ventriculomegaly and intracranial shunts. No definite new intracranial abnormalities identified on exam limited by beam patient motion artifacts. Electronically Signed   By: Lavonia Dana M.D.   On: 05/17/2017 23:08    Procedures Procedures (including critical care time)  Medications Ordered in ED Medications  sodium chloride 0.9 % bolus 1,000 mL (1,000 mLs Intravenous New Bag/Given 05/17/17 2109)     Initial Impression / Assessment  and Plan / ED Course  I have reviewed the triage vital signs and the nursing notes.  Pertinent labs & imaging results that were available during my care of the patient were reviewed by me and considered in my medical decision making (see chart for details).  Clinical Course as of May 17 2326  [facility] May 17, 2017  2108 Nl CMET on 10/5  [JK]    Clinical Course User Index [JK] Dorie Rank, MD    No acute findings noted on CT scan.   Labs are reassuring except for his phenytoin level.  This could be contributing to his mental status changes.  Pt is cared for by his family.  They are comfortable taking him home.  Will have pt hold his dilantin, contact his neurologist tomorrow to discuss when it would be appropriate for him to resume and at what dose.  Final Clinical Impressions(s) / ED Diagnoses   Final diagnoses:  Accidental phenytoin poisoning, initial encounter     New Prescriptions Current Discharge Medication List       Dorie Rank, MD 05/17/17 2329

## 2017-05-17 NOTE — Telephone Encounter (Signed)
Spoke to daughter about bloodwork and EEG findings, no electrographic seizures seen. EEG showed diffuse slowing with frequent right temporal epileptiform discharges. Increase Lamictal to 100mg  BID, continue Dilantin 100mg  daily. At this point would recommend going to the ER for continued altered mental status. He will need brain imaging, and with their report of decreased urine output and low BP, in setting of AMS, will need further workup. Discussed normal CBC, CMP. The Lamictal level is low, but he is on a low dose and this should be increased. Dilantin level is still pending. Daughter expressed understanding and will bring him to ER.

## 2017-05-17 NOTE — Discharge Instructions (Signed)
Stop taking the dilantin (phenytoin) for now because the level was elevated.  Speak with Dr Delice Lesch tomorrow to see when it would be appropriate to restart and at what dose.

## 2017-05-19 LAB — COMPREHENSIVE METABOLIC PANEL
AG Ratio: 1.4 (calc) (ref 1.0–2.5)
ALBUMIN MSPROF: 3.8 g/dL (ref 3.6–5.1)
ALKALINE PHOSPHATASE (APISO): 109 U/L (ref 40–115)
ALT: 9 U/L (ref 9–46)
AST: 13 U/L (ref 10–35)
BILIRUBIN TOTAL: 0.3 mg/dL (ref 0.2–1.2)
BUN: 22 mg/dL (ref 7–25)
CALCIUM: 9 mg/dL (ref 8.6–10.3)
CO2: 24 mmol/L (ref 20–32)
CREATININE: 0.93 mg/dL (ref 0.70–1.18)
Chloride: 102 mmol/L (ref 98–110)
Globulin: 2.7 g/dL (calc) (ref 1.9–3.7)
Glucose, Bld: 109 mg/dL — ABNORMAL HIGH (ref 65–99)
POTASSIUM: 3.8 mmol/L (ref 3.5–5.3)
Sodium: 140 mmol/L (ref 135–146)
TOTAL PROTEIN: 6.5 g/dL (ref 6.1–8.1)

## 2017-05-19 LAB — PHENYTOIN LEVEL, FREE AND TOTAL: PHENYTOIN, FREE: 4.2 mg/L — AB (ref 1.0–2.0)

## 2017-05-19 LAB — CBC WITH DIFFERENTIAL/PLATELET
BASOS ABS: 38 {cells}/uL (ref 0–200)
Basophils Relative: 0.5 %
EOS ABS: 90 {cells}/uL (ref 15–500)
Eosinophils Relative: 1.2 %
HCT: 43 % (ref 38.5–50.0)
HEMOGLOBIN: 15 g/dL (ref 13.2–17.1)
Lymphs Abs: 1343 cells/uL (ref 850–3900)
MCH: 31.8 pg (ref 27.0–33.0)
MCHC: 34.9 g/dL (ref 32.0–36.0)
MCV: 91.3 fL (ref 80.0–100.0)
MONOS PCT: 9.5 %
MPV: 9.5 fL (ref 7.5–12.5)
NEUTROS ABS: 5318 {cells}/uL (ref 1500–7800)
Neutrophils Relative %: 70.9 %
PLATELETS: 376 10*3/uL (ref 140–400)
RBC: 4.71 10*6/uL (ref 4.20–5.80)
RDW: 13 % (ref 11.0–15.0)
Total Lymphocyte: 17.9 %
WBC: 7.5 10*3/uL (ref 3.8–10.8)
WBCMIX: 713 {cells}/uL (ref 200–950)

## 2017-05-19 LAB — VITAMIN B12: VITAMIN B 12: 366 pg/mL (ref 200–1100)

## 2017-05-19 LAB — LAMOTRIGINE LEVEL: Lamotrigine Lvl: 0.6 ug/mL — ABNORMAL LOW (ref 4.0–18.0)

## 2017-05-19 LAB — TSH: TSH: 1.45 m[IU]/L (ref 0.40–4.50)

## 2017-06-08 ENCOUNTER — Telehealth: Payer: Self-pay

## 2017-06-08 NOTE — Telephone Encounter (Signed)
Spoke with pt's daughter, Wilburn Cornelia, who states that pt is doing great.  "a complete 180".  Asked that she call us if that status changes.  Wilburn Cornelia was at work and placed me on hold to answer another call.  Call was dropped shortly after that.

## 2017-06-08 NOTE — Telephone Encounter (Signed)
-----   Message from Cameron Sprang, MD sent at 06/08/2017 12:46 PM EDT ----- Regarding: follow-up Wondering how he is doing, can you pls check in with daughter? Thanks!

## 2017-07-05 ENCOUNTER — Encounter: Payer: Self-pay | Admitting: Neurology

## 2017-07-05 ENCOUNTER — Ambulatory Visit (INDEPENDENT_AMBULATORY_CARE_PROVIDER_SITE_OTHER): Admitting: Neurology

## 2017-07-05 VITALS — BP 130/70 | HR 88 | Ht 66.0 in | Wt 107.4 lb

## 2017-07-05 DIAGNOSIS — G40209 Localization-related (focal) (partial) symptomatic epilepsy and epileptic syndromes with complex partial seizures, not intractable, without status epilepticus: Secondary | ICD-10-CM | POA: Diagnosis not present

## 2017-07-05 DIAGNOSIS — Z87898 Personal history of other specified conditions: Secondary | ICD-10-CM | POA: Diagnosis not present

## 2017-07-05 MED ORDER — LAMOTRIGINE 100 MG PO TABS: ORAL_TABLET | ORAL | 11 refills | Status: DC

## 2017-07-05 NOTE — Progress Notes (Signed)
NEUROLOGY FOLLOW UP OFFICE NOTE  SABAN HEINLEN [id number] 05/15/1941  HISTORY OF PRESENT ILLNESS: I had the pleasure of seeing Butler Vegh in follow-up in the neurology clinic on 07/05/2017.  The patient was last seen 6 weeks ago for altered mental status. On his initial clinic visit 6 weeks prior at the end of August, he had been admitted to the hospital for supratherapeutic Dilantin levels. Family was given instructions to start Lamotrigine and start tapering down Dilantin. His daughter had confirmed during his visit on 05/14/17 that he was taking Lamictal 2 tabs twice a day and Dilantin 1 cap daily. On that visit, family reported a significant change in mental status, he was unable to feed himself and needed more assistance with transfers. Family denied any seizures, but reported hallucinations. He had an EEG done in the office which did not show any electrographic seizure, there was moderate diffuse background slowing and frequent right mid-temporal epileptiform discharges. He was sent to the ER for evaluation, head CT without contrast did not show any acute changes, there were post-surgical changes from the occipital and right temporal craniotomies with areas of resection and encephalomalacia at the anterior right temporal lobe and at the posterior fossa. His Dilantin level was supratherapeutic at 46.2. I spoke to his daughter again and she then reported that they were giving the medications with instructions in reverse, Dilantin 2 caps BID and Lamictal 1 tab daily, thus causing the significant increase in Dilantin level. Dilantin was held in the ER, I instructed her to continue on discontinuing the Dilantin and start uptitrating the Lamictal. He is now on Lamictal 100mg  BID without side effects. He is much improved today, interactive, able to follow commands. His family reports that he is feeding himself and has gained weight. Since his last visit, he has had one witnessed convulsion with head deviation  to the left lasting 5 minutes. It took him 30-45 minutes to recover. He occasional gets dizzy with transfers, no nausea/vomiting.   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 He is total care at baseline, family helps him with dressing and bathing, and started noticing that he would be leaning forward and was more wobbly recently. His daughter bought him new shoes, and when she tried to put it on last 8/26, noticed that he was not reacting when his feet were touched, he would usually be very ticklish. His son-in-law put him in the tub for a bath, and he reported that he could not feel patchy areas on his left leg. Family also noticed his speech was slurred and he was more disoriented (could not remember her name). He was brought to the ER, and daughter reports by that time, he was back to baseline, not wanting staff to touch his feet. Family stays with him 24/7 and had not witnessed any seizure activity. In the ER, he was noted to have a supratherapeutic Dilantin level of 32. Records in the ER indicate he was on 300mg  TID, but his wife and daughter today confirm repeatedly that he has been on 100mg  TID Dilantin for many years with no change in dose, no new medications added. His wife gives his medications. CBC, BMP were unremarkable except for mildly low sodium of 132. Urinalysis negative.  I personally reviewed head CT without contrast which did not show any acute changes, there was severe atrophy with lateral ventriculomegaly, right frontal approach ventriculostomy catheter with tip in the temporal horn of the right lateral ventricle. 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 Family was instructed  to hold Dilantin until Neurology follow-up.  His family reports he has been doing well since hospital discharge, with no further slurred speech. There is some delay in answering questions, but he appears to be able to answer appropriately when asked about dizziness ("some, not all the time"). His vision is "sometimes blurry, sometimes clear," and states it is blurry today. He denies any neck/back pain, no paresthesias. His wife reports that he continues to have seizures every 2 months or so, he would start staring, get stiff with twitching, with head turn to the left. Seizures last 1-2 minutes, he is drowsy after. He wears adult diapers but family reports he has incontinence with the seizures. This year he has had around 4 seizures. His wife denies any other prior AEDs. They started noticing memory changes for the past several years, he would forget little things, or put shoes on the wrong foot. Family now puts on his clothes and bathes him. He usually repeats himself. They report sleep and appetite are good. His older sister had dementia.   Update 05/14/2017: Over the past 1.5 weeks, he had a significant change in mental status. They saw his PCP yesterday who felt that he may be overmedicated due to Dilantin and Lamotrigine combination. Over the past 1.5 weeks, his family reports that he just wants to lay in bed and does not want to do anything anymore. He used to be able to feed himself, but now his wife feeds him. He has to be carried to the commode. They have noticed a decrease in his urine output, previously they had to change his sheets a lot, but now the adult diapers are less wet. They have not noticed any change in urine color or smell. His BP has been running low. No fever per family. He also states he is dizzy when family tries to stand or sit him up. They have not witnessed any of his typical seizures with body stiffening. He has been hallucinating, speaking very loudly looking at the ceiling or  asking what is on the floor. He sees a little boy in a white hat or that someone bit his head. He told his wife he was dying. He has not been complaining of any headaches, no nausea/vomiting. When he eats, appetite is good but he has more difficulties swallowing. He is noted to have some right leg jerking in the office, his daughter reports he has always done that.   I personally reviewed head CT without contrast done 04/04/17, no acute changes seen. There was a right frontal approach ventriculostomy catheter with tip in the temporal horn of the right lateral ventricle. There was severe atrophy with lateral ventriculomegaly, cerebellar encephalomalacia and dilated extra-axial space of the posterior fossa.   PAST MEDICAL HISTORY: Past Medical History:  Diagnosis Date  . Brain tumor ([place])   . Epilepsy ([place])   . GERD (gastroesophageal reflux disease)   . Hyperlipidemia   . Hypertension   . Myocarditis ([place])   . S/P VP shunt   . Subacute bacterial endocarditis     MEDICATIONS:  Outpatient Encounter Medications as of 07/05/2017  Medication Sig Note  . aspirin EC 81 MG tablet Take 81  mg by mouth daily.   . Calcium Lactate 648 MG TABS Take 648 mg by mouth at bedtime.    Marland Kitchen ibuprofen (ADVIL,MOTRIN) 800 MG tablet Take 800 mg by mouth every 8 (eight) hours as needed (pain).    .     . Melatonin 10 MG TABS Take 10 mg by mouth at bedtime.   Marland Kitchen omeprazole (PRILOSEC) 20 MG capsule Take 20 mg by mouth daily.    . Riboflavin (B-2) 100 MG TABS Take 100 mg by mouth daily.    Marland Kitchen lamoTRIgine (LAMICTAL) 100 MG tablet Take 1 tablet (100 mg total) by mouth 2 (two) times daily. 05/17/2017: New directions given today - previous dose has been 50 mg twice daily since 04/19/17 - pt took 50 mg this morning  . [DISCONTINUED] phenytoin (DILANTIN) 100 MG ER capsule     No facility-administered encounter medications on file as of 07/05/2017.    ALLERGIES: No Known Allergies  FAMILY HISTORY: Family History  Problem  Relation Age of Onset  . Aneurysm Mother   . Dementia Sister   . Stomach cancer Sister   . Colon cancer Sister     SOCIAL HISTORY: Social History   Socioeconomic History  . Marital status: Married    Spouse name: Not on file  . Number of children: Not on file  . Years of education: Not on file  . Highest education level: Not on file  Social Needs  . Financial resource strain: Not on file  . Food insecurity - worry: Not on file  . Food insecurity - inability: Not on file  . Transportation needs - medical: Not on file  . Transportation needs - non-medical: Not on file  Occupational History  . Not on file  Tobacco Use  . Smoking status: Never Smoker  . Smokeless tobacco: Never Used  Substance and Sexual Activity  . Alcohol use: No  . Drug use: No  . Sexual activity: Not on file  Other Topics Concern  . Not on file  Social History Narrative   Pt and wife live with daughter and her family (spouse and 2 adult children) in 2 story home   Highest level of education; 8th grade   Retired Administrator, sports for [employer]   Has 2 children    REVIEW OF SYSTEMS:  Constitutional: No fevers, chills, or sweats, no generalized fatigue, change in appetite Eyes: No visual changes, double vision, eye pain Ear, nose and throat: No hearing loss, ear pain, nasal congestion, sore throat Cardiovascular: No chest pain, palpitations Respiratory:  No shortness of breath at rest or with exertion, wheezes GastrointestinaI: No nausea, vomiting, diarrhea, abdominal pain, fecal incontinence Genitourinary:  No dysuria, urinary retention or frequency Musculoskeletal:  No neck pain, back pain Integumentary: No rash, pruritus, skin lesions Neurological: as above Psychiatric: No depression, insomnia, anxiety Endocrine: No palpitations, fatigue, diaphoresis, mood swings, change in appetite, change in weight, increased thirst Hematologic/Lymphatic:  No anemia, purpura,  petechiae. Allergic/Immunologic: no itchy/runny eyes, nasal congestion, recent allergic reactions, rashes  PHYSICAL EXAM: Vitals:   07/05/17 1312  BP: 130/70  Pulse: 88  SpO2: 96%   General: No acute distress, sitting on wheelchair, mental status significantly improved from last visit Head:  Normocephalic/atraumatic Neck: supple, no paraspinal tenderness, full range of motion Heart:  Regular rate and rhythm Lungs:  Clear to auscultation bilaterally Back: No paraspinal tenderness Skin/Extremities: No rash, no edema Neurological Exam: alert and oriented to person, place. Knows it was Thanksgiving recently and what month Thanksgiving is  celebrated. He is able to follow commands. Able to name, repeat. Cranial nerves: Pupils equal, round, reactive to light.  Extraocular movements intact with no nystagmus. Visual fields full. No facial asymmetry, edentulous. Tongue, uvula, palate midline.  Motor: Bulk and tone normal, no cogwheeling, moves all extremities symmetrically. Sensation intact to light touch. No incoordination on finger to nose testing. Gait not tested.   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 His wife continued to report seizures every few months while on the Dilantin, we agreed to start Lamotrigine with plans to taper off Dilantin. Unfortunately there was misunderstanding with medication instructions, and family was inadvertently giving more Dilantin than instructed, with supratherapeutic level again last month. Family has since stopped the Dilantin and instructed to discard the medication, mental status is significantly improved since his last visit. He has had one seizure since his last visit, increase Lamotrigine to 150mg  BID. He will follow-up in 6 months and knows to call  for any changes.   Thank you for allowing me to participate in his care.  Please do not hesitate to call for any questions or concerns.  The duration of this appointment visit was 15 minutes of face-to-face time with the patient.  Greater than 50% of this time was spent in counseling, explanation of diagnosis, planning of further management, and coordination of care.   Ellouise Newer, M.D.   CC: Threasa Alpha, FNP

## 2017-07-05 NOTE — Patient Instructions (Addendum)
1. Increase Lamictal 100mg : Take 1.5 tablets twice a day 2. Follow-up in 6 months, call for any changes  Seizure Precautions: 1. If medication has been prescribed for you to prevent seizures, take it exactly as directed.  Do not stop taking the medicine without talking to your doctor first, even if you have not had a seizure in a long time.   2. Avoid activities in which a seizure would cause danger to yourself or to others.  Don't operate dangerous machinery, swim alone, or climb in high or dangerous places, such as on ladders, roofs, or girders.  Do not drive unless your doctor says you may.  3. If you have any warning that you may have a seizure, lay down in a safe place where you can't hurt yourself.    4.  No driving for 6 months from last seizure, as per [facility].   Please refer to the following link on the [place] website for more information: http://www.epilepsyfoundation.org/answerplace/Social/driving/drivingu.cfm   5.  Maintain good sleep hygiene. Avoid alcohol.  6.  Contact your doctor if you have any problems that may be related to the medicine you are taking.  7.  Call 911 and bring the patient back to the ED if:        A.  The seizure lasts longer than 5 minutes.       B.  The patient doesn't awaken shortly after the seizure  C.  The patient has new problems such as difficulty seeing, speaking or moving  D.  The patient was injured during the seizure  E.  The patient has a temperature over 102 F (39C)  F.  The patient vomited and now is having trouble breathing

## 2017-08-16 ENCOUNTER — Inpatient Hospital Stay (HOSPITAL_COMMUNITY)
Admission: EM | Admit: 2017-08-16 | Discharge: 2017-08-18 | DRG: 101 | Disposition: A | Discharge status: Home / Self Care | Attending: Internal Medicine | Admitting: Internal Medicine

## 2017-08-16 ENCOUNTER — Emergency Department (HOSPITAL_COMMUNITY)

## 2017-08-16 DIAGNOSIS — Z79899 Other long term (current) drug therapy: Secondary | ICD-10-CM

## 2017-08-16 DIAGNOSIS — I10 Essential (primary) hypertension: Secondary | ICD-10-CM | POA: Diagnosis present

## 2017-08-16 DIAGNOSIS — Z9889 Other specified postprocedural states: Secondary | ICD-10-CM | POA: Diagnosis not present

## 2017-08-16 DIAGNOSIS — H518 Other specified disorders of binocular movement: Secondary | ICD-10-CM | POA: Diagnosis present

## 2017-08-16 DIAGNOSIS — H5500 Unspecified nystagmus: Secondary | ICD-10-CM | POA: Diagnosis present

## 2017-08-16 DIAGNOSIS — Z7982 Long term (current) use of aspirin: Secondary | ICD-10-CM

## 2017-08-16 DIAGNOSIS — Z23 Encounter for immunization: Secondary | ICD-10-CM

## 2017-08-16 DIAGNOSIS — G40201 Localization-related (focal) (partial) symptomatic epilepsy and epileptic syndromes with complex partial seizures, not intractable, with status epilepticus: Secondary | ICD-10-CM | POA: Diagnosis not present

## 2017-08-16 DIAGNOSIS — E785 Hyperlipidemia, unspecified: Secondary | ICD-10-CM | POA: Diagnosis present

## 2017-08-16 DIAGNOSIS — K219 Gastro-esophageal reflux disease without esophagitis: Secondary | ICD-10-CM | POA: Diagnosis not present

## 2017-08-16 DIAGNOSIS — Z87820 Personal history of traumatic brain injury: Secondary | ICD-10-CM

## 2017-08-16 DIAGNOSIS — Z982 Presence of cerebrospinal fluid drainage device: Secondary | ICD-10-CM

## 2017-08-16 DIAGNOSIS — R569 Unspecified convulsions: Secondary | ICD-10-CM

## 2017-08-16 DIAGNOSIS — R000 Tachycardia, unspecified: Secondary | ICD-10-CM

## 2017-08-16 DIAGNOSIS — Z87898 Personal history of other specified conditions: Secondary | ICD-10-CM

## 2017-08-16 DIAGNOSIS — F0390 Unspecified dementia without behavioral disturbance: Secondary | ICD-10-CM | POA: Diagnosis present

## 2017-08-16 DIAGNOSIS — Z85841 Personal history of malignant neoplasm of brain: Secondary | ICD-10-CM

## 2017-08-16 DIAGNOSIS — G8384 Todd's paralysis (postepileptic): Secondary | ICD-10-CM | POA: Diagnosis not present

## 2017-08-16 DIAGNOSIS — G40209 Localization-related (focal) (partial) symptomatic epilepsy and epileptic syndromes with complex partial seizures, not intractable, without status epilepticus: Secondary | ICD-10-CM | POA: Diagnosis present

## 2017-08-16 DIAGNOSIS — G8194 Hemiplegia, unspecified affecting left nondominant side: Secondary | ICD-10-CM | POA: Diagnosis present

## 2017-08-16 HISTORY — DX: Headache: R51

## 2017-08-16 HISTORY — DX: Headache, unspecified: R51.9

## 2017-08-16 HISTORY — DX: Rheumatic mitral valve disease, unspecified: I05.9

## 2017-08-16 HISTORY — DX: Unspecified osteoarthritis, unspecified site: M19.90

## 2017-08-16 HISTORY — DX: Malignant melanoma of nose: C43.31

## 2017-08-16 LAB — COMPREHENSIVE METABOLIC PANEL
ALT: 8 U/L — ABNORMAL LOW (ref 17–63)
ANION GAP: 5 (ref 5–15)
AST: 25 U/L (ref 15–41)
Albumin: 2.5 g/dL — ABNORMAL LOW (ref 3.5–5.0)
Alkaline Phosphatase: 88 U/L (ref 38–126)
BILIRUBIN TOTAL: 0.8 mg/dL (ref 0.3–1.2)
BUN: 16 mg/dL (ref 6–20)
CHLORIDE: 111 mmol/L (ref 101–111)
CO2: 21 mmol/L — ABNORMAL LOW (ref 22–32)
Calcium: 7.5 mg/dL — ABNORMAL LOW (ref 8.9–10.3)
Creatinine, Ser: 0.96 mg/dL (ref 0.61–1.24)
Glucose, Bld: 99 mg/dL (ref 65–99)
POTASSIUM: 4.5 mmol/L (ref 3.5–5.1)
Sodium: 137 mmol/L (ref 135–145)
TOTAL PROTEIN: 5 g/dL — AB (ref 6.5–8.1)

## 2017-08-16 LAB — CBC
HCT: 39.4 % (ref 39.0–52.0)
HEMOGLOBIN: 12.4 g/dL — AB (ref 13.0–17.0)
MCH: 30.9 pg (ref 26.0–34.0)
MCHC: 31.5 g/dL (ref 30.0–36.0)
MCV: 98.3 fL (ref 78.0–100.0)
Platelets: 252 10*3/uL (ref 150–400)
RBC: 4.01 MIL/uL — AB (ref 4.22–5.81)
RDW: 14 % (ref 11.5–15.5)
WBC: 7.3 10*3/uL (ref 4.0–10.5)

## 2017-08-16 LAB — I-STAT CHEM 8, ED
BUN: 23 mg/dL — ABNORMAL HIGH (ref 6–20)
CALCIUM ION: 0.9 mmol/L — AB (ref 1.15–1.40)
Chloride: 108 mmol/L (ref 101–111)
Creatinine, Ser: 1.2 mg/dL (ref 0.61–1.24)
GLUCOSE: 159 mg/dL — AB (ref 65–99)
HCT: 43 % (ref 39.0–52.0)
Hemoglobin: 14.6 g/dL (ref 13.0–17.0)
POTASSIUM: 4.7 mmol/L (ref 3.5–5.1)
SODIUM: 138 mmol/L (ref 135–145)
TCO2: 18 mmol/L — ABNORMAL LOW (ref 22–32)

## 2017-08-16 LAB — PHENYTOIN LEVEL, TOTAL

## 2017-08-16 MED ORDER — SODIUM CHLORIDE 0.9 % IV SOLN
INTRAVENOUS | Status: AC
  Administered 2017-08-16 – 2017-08-17 (×2): via INTRAVENOUS

## 2017-08-16 MED ORDER — MELATONIN 3 MG PO TABS
9.0000 mg | ORAL_TABLET | Freq: Every day | ORAL | Status: DC
  Administered 2017-08-16 – 2017-08-17 (×2): 9 mg via ORAL
  Filled 2017-08-16 (×3): qty 3

## 2017-08-16 MED ORDER — SODIUM CHLORIDE 0.9 % IV SOLN
500.0000 mg | Freq: Once | INTRAVENOUS | Status: AC
  Administered 2017-08-16: 500 mg via INTRAVENOUS
  Filled 2017-08-16: qty 5

## 2017-08-16 MED ORDER — ACETAMINOPHEN 650 MG RE SUPP: 650.0000 mg | Freq: Four times a day (QID) | RECTAL | Status: DC | PRN

## 2017-08-16 MED ORDER — ENOXAPARIN SODIUM 40 MG/0.4ML ~~LOC~~ SOLN
40.0000 mg | SUBCUTANEOUS | Status: DC
  Administered 2017-08-17 – 2017-08-18 (×2): 40 mg via SUBCUTANEOUS
  Filled 2017-08-16 (×2): qty 0.4

## 2017-08-16 MED ORDER — PANTOPRAZOLE SODIUM 40 MG PO TBEC
40.0000 mg | DELAYED_RELEASE_TABLET | Freq: Every day | ORAL | Status: DC
  Administered 2017-08-16 – 2017-08-18 (×3): 40 mg via ORAL
  Filled 2017-08-16 (×3): qty 1

## 2017-08-16 MED ORDER — LIDOCAINE HCL (PF) 1 % IJ SOLN
5.0000 mL | Freq: Once | INTRAMUSCULAR | Status: AC
  Administered 2017-08-16: 5 mL via INTRADERMAL

## 2017-08-16 MED ORDER — LORAZEPAM 2 MG/ML IJ SOLN
1.0000 mg | Freq: Once | INTRAMUSCULAR | Status: AC
  Administered 2017-08-16: 1 mg via INTRAVENOUS

## 2017-08-16 MED ORDER — LIDOCAINE HCL (PF) 1 % IJ SOLN
INTRAMUSCULAR | Status: AC
  Filled 2017-08-16: qty 30

## 2017-08-16 MED ORDER — ACETAMINOPHEN 325 MG PO TABS: 650.0000 mg | ORAL_TABLET | Freq: Four times a day (QID) | ORAL | Status: DC | PRN

## 2017-08-16 MED ORDER — LAMOTRIGINE 200 MG PO TABS: 200.0000 mg | ORAL_TABLET | Freq: Two times a day (BID) | ORAL | Status: DC

## 2017-08-16 MED ORDER — SODIUM CHLORIDE 0.9 % IV BOLUS (SEPSIS)
1000.0000 mL | Freq: Once | INTRAVENOUS | Status: AC
  Administered 2017-08-16: 1000 mL via INTRAVENOUS

## 2017-08-16 MED ORDER — SODIUM CHLORIDE 0.9 % IV SOLN
1000.0000 mg | INTRAVENOUS | Status: AC
  Administered 2017-08-16: 1000 mg via INTRAVENOUS
  Filled 2017-08-16: qty 10

## 2017-08-16 MED ORDER — SENNOSIDES-DOCUSATE SODIUM 8.6-50 MG PO TABS: 1.0000 | ORAL_TABLET | Freq: Every evening | ORAL | Status: DC | PRN

## 2017-08-16 MED ORDER — LEVETIRACETAM 500 MG/5ML IV SOLN
500.0000 mg | Freq: Two times a day (BID) | INTRAVENOUS | Status: DC
  Administered 2017-08-16 – 2017-08-17 (×3): 500 mg via INTRAVENOUS
  Filled 2017-08-16 (×5): qty 5

## 2017-08-16 MED ORDER — SODIUM CHLORIDE 0.9 % IV BOLUS (SEPSIS)
500.0000 mL | Freq: Once | INTRAVENOUS | Status: AC
  Administered 2017-08-16: 500 mL via INTRAVENOUS

## 2017-08-16 MED ORDER — LORAZEPAM 2 MG/ML IJ SOLN
INTRAMUSCULAR | Status: AC
  Filled 2017-08-16: qty 1

## 2017-08-16 MED ORDER — ASPIRIN EC 81 MG PO TBEC
81.0000 mg | DELAYED_RELEASE_TABLET | Freq: Every day | ORAL | Status: DC
  Administered 2017-08-17 – 2017-08-18 (×2): 81 mg via ORAL
  Filled 2017-08-16 (×2): qty 1

## 2017-08-16 MED ORDER — IOPAMIDOL (ISOVUE-370) INJECTION 76%
INTRAVENOUS | Status: AC
  Filled 2017-08-16: qty 50

## 2017-08-16 NOTE — ED Notes (Signed)
Chem 8 did not cross over  Values below Na 138 K 4.7 Cl 108 iCa 0.90 TC02 18 Glu 159 BUN 23 Crea 1.2 Hct 43 Hb 14.6 AnGap 18

## 2017-08-16 NOTE — ED Notes (Signed)
Pt in MRI.

## 2017-08-16 NOTE — ED Triage Notes (Signed)
Brought by ems from home.  Found seizing on their arrival.  Reported that he had 2 15 min seizures with a rest in between.  Given versed 2.5mg  IM, then 2.5 IV via ems.  CBG-166.

## 2017-08-16 NOTE — ED Notes (Signed)
Delay in lab draw,  Pt not in room 

## 2017-08-16 NOTE — Consult Note (Signed)
NEURO HOSPITALIST CONSULT NOTE   Requestig physician: Dr. Tomi Bamberger   Reason for Consult: Seizure   History obtained from:  Patient   Chart    HPI:                                                                                                                                          Nicholas Hodges is an 77 y.o. 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   Patient has had brain tumor excision.  Patient is on Lamictal 150 mg twice daily and followed by Dr. Rosalie Doctor as outpatient.  Unfortunately patient also has a history of dementia.  Looking through Dr. Amparo Bristol note patient has been on Dilantin in the past however had supratherapeutic Dilantin levels thus was tapered down off Dilantin and placed on Lamictal.  As of note on 07/05/2017 patient had had no further seizures however was having he did have EEG at that time which did not show any electrographic seizures.  However did show moderate diffuse background slowing and frequent right mid temporal epileptiform discharges.  While in the emergency department patient did have an MRI of his brain which showed no acute intracranial findings.  Did show previous right craniotomy.  Previous suboccipital craniotomy with chronic pseudo-meningocele.  Patient's MRA was limited secondary to motion however did show patent large vessels.  Currently patient is alert, able to follow simple commands with his right side, not showing any tonic-clonic seizures.  Per family members who is in the room approximately 2:00 this a.m. wife was awoken secondary to the patient  chronically swallowing and then going into a approximately 20-minute upper extremity tonic-clonic seizure.  Post seizure he is showing a right eye deviation and plegia of the left side both arm and leg.  Patient as stated is on Lamictal and per family has been taking the medication as directed.  Has not been under any stress, had any infection that they know of, and sleeping well.  Discussion with Dr. Delice Lesch was made and she would like to increase his Lamictal to 200 twice daily once able to swallow.  In the interim she would like to have him on Keppra.  Past Medical History:  Diagnosis Date  . Brain tumor ([place])   . Epilepsy ([place])   . GERD (gastroesophageal reflux disease)   . Hyperlipidemia   . Hypertension   . Myocarditis ([place])   . S/P VP shunt   .  Subacute bacterial endocarditis     Past Surgical History:  Procedure Laterality Date  . BRAIN TUMOR EXCISION    . foot surgery      Family History  Problem Relation Age of Onset  . Aneurysm Mother   . Dementia Sister   . Stomach cancer Sister   . Colon cancer Sister       Social History:  reports that  has never smoked. he has never used smokeless tobacco. He reports that he does not drink alcohol or use drugs.  No Known Allergies  MEDICATIONS:                                                                                                                     Current Facility-Administered Medications  Medication Dose Route Frequency Provider Last Rate Last Dose  . iopamidol (ISOVUE-370) 76 % injection            Current Outpatient Medications  Medication Sig Dispense Refill  . aspirin EC 81 MG tablet Take 81 mg by mouth daily.    . Calcium Lactate 648 MG TABS Take 648 mg by mouth at bedtime.     Marland Kitchen ibuprofen (ADVIL,MOTRIN) 800 MG tablet Take 800 mg by mouth every 8 (eight) hours as needed (pain).     Marland Kitchen lamoTRIgine (LAMICTAL) 100 MG tablet Take 1.5 tablets twice a day 90 tablet 11  . Melatonin 10 MG TABS Take 10 mg by mouth  at bedtime.    Marland Kitchen omeprazole (PRILOSEC) 20 MG capsule Take 20 mg by mouth daily.     . Riboflavin (B-2) 100 MG TABS Take 100 mg by mouth daily.         ROS:                                                                                                                                       History obtained from Family  General ROS: negative for - chills, fatigue, fever, night sweats, weight gain or weight loss Psychological ROS: negative for - behavioral disorder, hallucinations, memory difficulties, mood swings or suicidal ideation Ophthalmic ROS: negative for - blurry vision, double vision, eye pain or loss of vision ENT ROS: negative for - epistaxis, nasal discharge, oral lesions, sore throat, tinnitus or vertigo Allergy and Immunology ROS: negative for - hives or itchy/watery eyes Hematological and Lymphatic ROS: negative for - bleeding problems, bruising or swollen lymph nodes  Endocrine ROS: negative for - galactorrhea, hair pattern changes, polydipsia/polyuria or temperature intolerance Respiratory ROS: negative for - cough, hemoptysis, shortness of breath or wheezing Cardiovascular ROS: negative for - chest pain, dyspnea on exertion, edema or irregular heartbeat Gastrointestinal ROS: negative for - abdominal pain, diarrhea, hematemesis, nausea/vomiting or stool incontinence Genito-Urinary ROS: negative for - dysuria, hematuria, incontinence or urinary frequency/urgency Musculoskeletal ROS: negative for - joint swelling or muscular weakness Neurological ROS: as noted in HPI Dermatological ROS: negative for rash and skin lesion changes   Blood pressure 101/67, pulse 85, temperature 97.7 F (36.5 C), resp. rate 17, height 5\' 6"  (1.676 m), weight 47.6 kg (105 lb), SpO2 98 %.   Neurologic Examination:                                                                                                      HEENT-  Normocephalic, no lesions, without obvious abnormality.  Normal external eye  and conjunctiva.  Normal TM's bilaterally.  Normal auditory canals and external ears. Normal external nose, mucus membranes and septum.  Normal pharynx. Cardiovascular- S1, S2 normal, pulses palpable throughout   Lungs- chest clear, no wheezing, rales, normal symmetric air entry Abdomen- normal findings: bowel sounds normal Extremities- no edema Lymph-no adenopathy palpable Musculoskeletal-no joint tenderness, deformity or swelling Skin-warm and dry, no hyperpigmentation, vitiligo, or suspicious lesions  Neurological Examination Mental Status: Alert, not oriented.  Speech minimal but not aphasic.   Able to follow simple commands on the right Cranial Nerves: II: blinks to threat on the right side III,IV, VI: Eyes with forced deviation to the right.  Pupils equal round and reactive  V,VII: smile symmetric, facial light touch sensation normal bilaterally VIII: hearing normal bilaterally IX,X: uvula rises symmetrically XI: Normal right shoulder shrug XII: midline tongue extension Motor: Right : Upper extremity   5/5    Left:     Upper extremity   0/5  Lower extremity   5/5     Lower extremity   2/5 Tone and bulk:normal tone throughout; no atrophy noted Sensory: Pinprick and light touch intact throughout, bilaterally Deep Tendon Reflexes: 2+ and symmetric throughout upper extremities 1+ bilateral lower extremities with no Achilles tendon reflexes Plantars: Right: upgoing   Left: downgoing Cerebellar: Right-sided finger-nose did not show any dysmetria.  Right-sided heel to shin did not show any dysmetria.   Gait: Not tested      Lab Results: Basic Metabolic Panel: No results for input(s): NA, K, CL, CO2, GLUCOSE, BUN, CREATININE, CALCIUM, MG, PHOS in the last 168 hours.  Liver Function Tests: No results for input(s): AST, ALT, ALKPHOS, BILITOT, PROT, ALBUMIN in the last 168 hours. No results for input(s): LIPASE, AMYLASE in the last 168 hours. No results for input(s): AMMONIA in  the last 168 hours.  CBC: No results for input(s): WBC, NEUTROABS, HGB, HCT, MCV, PLT in the last 168 hours.  Cardiac Enzymes: No results for input(s): CKTOTAL, CKMB, CKMBINDEX, TROPONINI in the last 168 hours.  Lipid Panel: No results for input(s): CHOL, TRIG, HDL, CHOLHDL, VLDL, LDLCALC in the last 168 hours.  CBG: No results for input(s): GLUCAP in the last 168 hours.  Microbiology: No results found for this or any previous visit.  Coagulation Studies: No results for input(s): LABPROT, INR in the last 72 hours.  Imaging: Ct Angio Head W Or Wo Contrast  Result Date: 08/16/2017 CLINICAL DATA:  History of seizure. History of previous shunt. Patient had tenuous venous access that extravasated with contrast injection, therefore CTA was not achieved. EXAM: CT HEAD WITHOUT CONTRAST CT ANGIOGRAPHY OF THE HEAD AND NECK TECHNIQUE: Contiguous axial images were obtained from the base of the skull through the vertex without intravenous contrast. Multidetector CT imaging of the head and neck was performed using the standard protocol during bolus administration of intravenous contrast. Multiplanar CT image reconstructions and MIPs were obtained to evaluate the vascular anatomy. Carotid stenosis measurements (when applicable) are obtained utilizing NASCET criteria, using the distal internal carotid diameter as the denominator. CONTRAST:  50 cc Isovue-300 extravasated. Evaluation by the on-site radiologist was carried out and follow-up orders placed. COMPARISON:  05/17/2017 FINDINGS: CT HEAD Brain: Chronic generalized atrophy. Occipital craniectomy with an apparently defunct shunt catheter in that region, extending through a right parietal burr hole. This enters the right lateral ventricle and extends into the temporal horn. Chronic midline cerebellar atrophy/ encephalomalacia appears the same. Ventricular size is the same. Chronic encephalomalacia of the right temporal lobe is the same. Vascular clips again  evident in the anterior middle cranial fossa. No sign of acute infarction, mass lesion or hemorrhage. Vascular: There is atherosclerotic calcification of the major vessels at the base of the brain. Skull: Extensive craniotomy changes posterior fossa and right pterional. Sinuses/Orbits: No significant sinus inflammatory disease. Few opacified ethmoid air cells. As noted above, CTA of the head neck was unsuccessful. IMPRESSION: No acute brain finding. Extensive posterior fossa an right middle cranial fossa postoperative changes with brain encephalomalacia in those areas. Chronic ventriculomegaly which has not changed. Nonfunctional shunt tube. Electronically Signed   By: Nelson Chimes M.D.   On: 08/16/2017 06:45   Ct Head Wo Contrast  Result Date: 08/16/2017 CLINICAL DATA:  History of seizure. History of previous shunt. Patient had tenuous venous access that extravasated with contrast injection, therefore CTA was not achieved. EXAM: CT HEAD WITHOUT CONTRAST CT ANGIOGRAPHY OF THE HEAD AND NECK TECHNIQUE: Contiguous axial images were obtained from the base of the skull through the vertex without intravenous contrast. Multidetector CT imaging of the head and neck was performed using the standard protocol during bolus administration of intravenous contrast. Multiplanar CT image reconstructions and MIPs were obtained to evaluate the vascular anatomy. Carotid stenosis measurements (when applicable) are obtained utilizing NASCET criteria, using the distal internal carotid diameter as the denominator. CONTRAST:  50 cc Isovue-300 extravasated. Evaluation by the on-site radiologist was carried out and follow-up orders placed. COMPARISON:  05/17/2017 FINDINGS: CT HEAD Brain: Chronic generalized atrophy. Occipital craniectomy with an apparently defunct shunt catheter in that region, extending through a right parietal burr hole. This enters the right lateral ventricle and extends into the temporal horn. Chronic midline  cerebellar atrophy/ encephalomalacia appears the same. Ventricular size is the same. Chronic encephalomalacia of the right temporal lobe is the same. Vascular clips again evident in the anterior middle cranial fossa. No sign of acute infarction, mass lesion or hemorrhage. Vascular: There is atherosclerotic calcification of the major vessels at the base of the brain. Skull: Extensive craniotomy changes posterior fossa and right pterional. Sinuses/Orbits: No significant sinus inflammatory disease. Few opacified ethmoid air cells. As  noted above, CTA of the head neck was unsuccessful. IMPRESSION: No acute brain finding. Extensive posterior fossa an right middle cranial fossa postoperative changes with brain encephalomalacia in those areas. Chronic ventriculomegaly which has not changed. Nonfunctional shunt tube. Electronically Signed   By: Nelson Chimes M.D.   On: 08/16/2017 06:45   Ct Angio Neck W And/or Wo Contrast  Result Date: 08/16/2017 CLINICAL DATA:  History of seizure. History of previous shunt. Patient had tenuous venous access that extravasated with contrast injection, therefore CTA was not achieved. EXAM: CT HEAD WITHOUT CONTRAST CT ANGIOGRAPHY OF THE HEAD AND NECK TECHNIQUE: Contiguous axial images were obtained from the base of the skull through the vertex without intravenous contrast. Multidetector CT imaging of the head and neck was performed using the standard protocol during bolus administration of intravenous contrast. Multiplanar CT image reconstructions and MIPs were obtained to evaluate the vascular anatomy. Carotid stenosis measurements (when applicable) are obtained utilizing NASCET criteria, using the distal internal carotid diameter as the denominator. CONTRAST:  50 cc Isovue-300 extravasated. Evaluation by the on-site radiologist was carried out and follow-up orders placed. COMPARISON:  05/17/2017 FINDINGS: CT HEAD Brain: Chronic generalized atrophy. Occipital craniectomy with an apparently  defunct shunt catheter in that region, extending through a right parietal burr hole. This enters the right lateral ventricle and extends into the temporal horn. Chronic midline cerebellar atrophy/ encephalomalacia appears the same. Ventricular size is the same. Chronic encephalomalacia of the right temporal lobe is the same. Vascular clips again evident in the anterior middle cranial fossa. No sign of acute infarction, mass lesion or hemorrhage. Vascular: There is atherosclerotic calcification of the major vessels at the base of the brain. Skull: Extensive craniotomy changes posterior fossa and right pterional. Sinuses/Orbits: No significant sinus inflammatory disease. Few opacified ethmoid air cells. As noted above, CTA of the head neck was unsuccessful. IMPRESSION: No acute brain finding. Extensive posterior fossa an right middle cranial fossa postoperative changes with brain encephalomalacia in those areas. Chronic ventriculomegaly which has not changed. Nonfunctional shunt tube. Electronically Signed   By: Nelson Chimes M.D.   On: 08/16/2017 06:45   Mr Angiogram Head Wo Contrast  Result Date: 08/16/2017 CLINICAL DATA:  Two seizure episodes.  Previous brain tumor. EXAM: MRI HEAD WITHOUT CONTRAST MRA HEAD WITHOUT CONTRAST TECHNIQUE: Multiplanar, multiecho pulse sequences of the brain and surrounding structures were obtained without intravenous contrast. Angiographic images of the head were obtained using MRA technique without contrast. COMPARISON:  CT same day FINDINGS: MRI HEAD FINDINGS Brain: Diffusion imaging does not show any acute or subacute infarction or other cause of restricted diffusion. There has been previous suboccipital craniectomy. 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 Chronic cerebellar atrophy with encephalomalacia of the vermis. Chronic brainstem atrophy. Cerebral hemispheres show shunt tube entering the posterior body of the right lateral ventricle and passing  into the right temporal horn. There is pronounced generalized cerebral atrophy, with asymmetric atrophy encephalomalacia of the right temporal lobe. Ventricular size is prominent but consistent with ex vacuo enlargement. This is stable by CT. No sign of neoplastic mass lesion. No sign of acute hemorrhage. Few scattered foci of hemosiderin deposition. No evidence of subdural collection. Vascular: Major vessels at the base of the brain show flow. Skull and upper cervical spine: Right pterional craniotomy. Suboccipital craniectomy. Sinuses/Orbits: Sinuses are clear. Orbits are negative. Previous lens implant on the right. Other: None MRA HEAD FINDINGS Both internal carotid arteries are patent through the skullbase and siphon regions. Anterior  and middle cerebral vessels show flow. Due to motion degradation, medium to small vessel detail is poor. Both vertebral arteries are patent to the basilar. No basilar stenosis. Major posterior circulation branch vessels are patent. Again, there is considerable motion degradation. IMPRESSION: No acute intracranial finding. Previous right pterional craniotomy. Previous suboccipital craniectomy with chronic pseudomeningocele. Pronounced atrophy/ postop encephalomalacia of the vermis. Pronounced atrophy/ postoperative encephalomalacia of the right temporal lobe. Generalized brain atrophy elsewhere. Shunt tube in place with chronic ventriculomegaly. Limited detail MR angiography because of motion shows patency of the large and medium size vessels. Electronically Signed   By: Nelson Chimes M.D.   On: 08/16/2017 07:33   Mr Brain Wo Contrast  Result Date: 08/16/2017 CLINICAL DATA:  Two seizure episodes.  Previous brain tumor. EXAM: MRI HEAD WITHOUT CONTRAST MRA HEAD WITHOUT CONTRAST TECHNIQUE: Multiplanar, multiecho pulse sequences of the brain and surrounding structures were obtained without intravenous contrast. Angiographic images of the head were obtained using MRA technique without  contrast. COMPARISON:  CT same day FINDINGS: MRI HEAD FINDINGS Brain: Diffusion imaging does not show any acute or subacute infarction or other cause of restricted diffusion. There has been previous suboccipital craniectomy. 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 Chronic cerebellar atrophy with encephalomalacia of the vermis. Chronic brainstem atrophy. Cerebral hemispheres show shunt tube entering the posterior body of the right lateral ventricle and passing into the right temporal horn. There is pronounced generalized cerebral atrophy, with asymmetric atrophy encephalomalacia of the right temporal lobe. Ventricular size is prominent but consistent with ex vacuo enlargement. This is stable by CT. No sign of neoplastic mass lesion. No sign of acute hemorrhage. Few scattered foci of hemosiderin deposition. No evidence of subdural collection. Vascular: Major vessels at the base of the brain show flow. Skull and upper cervical spine: Right pterional craniotomy. Suboccipital craniectomy. Sinuses/Orbits: Sinuses are clear. Orbits are negative. Previous lens implant on the right. Other: None MRA HEAD FINDINGS Both internal carotid arteries are patent through the skullbase and siphon regions. Anterior and middle cerebral vessels show flow. Due to motion degradation, medium to small vessel detail is poor. Both vertebral arteries are patent to the basilar. No basilar stenosis. Major posterior circulation branch vessels are patent. Again, there is considerable motion degradation. IMPRESSION: No acute intracranial finding. Previous right pterional craniotomy. Previous suboccipital craniectomy with chronic pseudomeningocele. Pronounced atrophy/ postop encephalomalacia of the vermis. Pronounced atrophy/ postoperative encephalomalacia of the right temporal lobe. Generalized brain atrophy elsewhere. Shunt tube in place with chronic ventriculomegaly. Limited detail MR angiography because of motion shows  patency of the large and medium size vessels. Electronically Signed   By: Nelson Chimes M.D.   On: 08/16/2017 07:33   Dg Chest Portable 1 View  Result Date: 08/16/2017 CLINICAL DATA:  Seizure.  Altered mental status.  Hypoxia. EXAM: PORTABLE CHEST 1 VIEW COMPARISON:  04/04/2017 FINDINGS: Chronic elevation of right hemidiaphragm. Adjacent right basilar atelectasis. A skin fold projects over the right upper chest. Heart is normal in size. Unchanged mediastinal contours. No confluent consolidation. No pleural effusion. Bones are under mineralized. IMPRESSION: Chronic elevation of right hemidiaphragm. Right lung base atelectasis or scarring. Electronically Signed   By: Jeb Levering M.D.   On: 08/16/2017 05:23         Assessment/Plan: This is a 77 year old male with history of resection of brain tumor, epilepsy since 1976.  He is on Lamictal 150 mg twice daily and has been stable on this medication for quite some time.  Patient suffered a breakthrough seizure this morning at approximately 2 AM which lasted for 20 minutes.  Currently patient is showing a left arm and leg plegia with right gaze deviation.  At this time will order stat EEG.  I have called Dr. Delice Lesch who is the primary neurologist and she agrees with increasing his Lamictal to 200 mg twice daily and placing him on Keppra in the interim.  Recommend: -Stat EEG - Loading dose of Keppra 1000 mg now with maintenance of 500 mg twice daily IV -Increase Lamictal to 200 mg twice daily when able to take p.o. Medications--at that time Keppra may be DC'd. -Seizure precautions -Follow-up Dr. Delice Lesch -Per [facility] statutes, patients with seizures are not allowed to drive until  they have been seizure-free for six months. Use caution when using heavy equipment or power tools. Avoid working on ladders or at heights. Take showers instead of baths. Ensure the water temperature is not too high on the home water heater. Do not go swimming alone.  When caring for infants or small children, sit down when holding, feeding, or changing them to minimize risk of injury to the child in the event you have a seizure.   Also, Maintain good sleep hygiene. Avoid alcohol.  --> Call 911 and bring the patient back to the ED if:                   A.  The seizure lasts longer than 5 minutes.                  B.  The patient doesn't awaken shortly after the seizure             C.  The patient has new problems such as difficulty seeing, speaking or moving             D.  The patient was injured during the seizure             E.  The patient has a temperature over 102 F (39C)             F.  The patient vomited and now is having trouble breathing  Assessment and plan per attending neurologist  Etta Quill PA-C [facility] [phone]  08/16/2017, 9:11 AM  I have seen the patient and reviewed the above notes.  On my initial exam, he actually had some subtle nystagmus with his right gaze deviation and therefore Ativan was ordered.  He actually received this during the EEG, without clear evidence of ongoing seizure though a deep focus may still be a concern.  He was loaded with IV Keppra, still has left hemiparesis and right gaze but without any nystagmus.  He does have better strength and is able to lift both his left arm and leg against gravity at the time of my examination.  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  He is improving (at least prior to the Ativan) and therefore would continue observation and use Keppra while unable to take p.o.  Roland Rack, MD [facility] [phone]  If 7pm- 7am, please page neurology on call as listed in [place].

## 2017-08-16 NOTE — ED Notes (Signed)
Was notified by Lab Lav tube for CBC clotted.  Unable to result.

## 2017-08-16 NOTE — ED Notes (Signed)
MD aware of BP

## 2017-08-16 NOTE — ED Notes (Signed)
Nicholas Bamberger, MD aware of inability to collect labs, [place] at bedside, MD to collect labs

## 2017-08-16 NOTE — ED Notes (Signed)
Tomi Bamberger, MD at bedside with [place] for blood collection, verbal order for Lidocaine for stick

## 2017-08-16 NOTE — ED Notes (Signed)
Mali G, 2nd RN attempting to collect labs

## 2017-08-16 NOTE — ED Provider Notes (Addendum)
Patient was seen by Dr. Venora Maples.  Please see his note.  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  Nursing staff was unable to obtain blood for blood tests.  I was asked to obtain blood samples Physical Exam  BP (!) 90/53   Pulse 73   Temp 97.7 F (36.5 C)   Resp 15   Ht 1.676 m (5\' 6" )   Wt 47.6 kg (105 lb)   SpO2 96%   BMI 16.95 kg/m   Physical Exam Please see Dr. Venora Maples note ED Course/Procedures     Aspiration of blood/fluid Date/Time: 08/16/2017 11:15 AM Performed by: Dorie Rank, MD Authorized by: Dorie Rank, MD  Consent: Verbal consent obtained. Risks and benefits: risks, benefits and alternatives were discussed Consent given by: patient Patient understanding: patient states understanding of the procedure being performed Patient consent: the patient's understanding of the procedure matches consent given Procedure consent: procedure consent matches procedure scheduled Relevant documents: relevant documents present and verified Test results: test results available and properly labeled Patient identity confirmed: verbally with patient Time out: Immediately prior to procedure a "time out" was called to verify the correct patient, procedure, equipment, support staff and site/side marked as required. Preparation: Patient was prepped and draped in the usual sterile fashion. Local anesthesia used: yes Anesthesia: local infiltration  Anesthesia: Local anesthesia used: yes Local Anesthetic: lidocaine 1% without epinephrine Anesthetic total: 1 mL  Sedation: Patient sedated: no  Patient tolerance: Patient tolerated the procedure well with no immediate complications Comments: Right femoral vein aspirated in order to obtain blood for blood tests.  US guidance used     MDM  Blood draw was successful.  Pt remains alert and answering questions.  Will arrange for admission once blood tests are back   Dorie Rank, MD 08/16/17 1120   Just  notified that pt's CBC was clotted and that is why it was cancelled.  I did a femoral stick for his previous blood draw.  Phlebotomy can potentially try again later if needed.   Dorie Rank, MD 08/16/17 1420

## 2017-08-16 NOTE — ED Notes (Addendum)
Ultrasound IV started by dr. Venora Maples.  Infiltrated while receiving contrast for CT scan.  Site noted to right upper arm.

## 2017-08-16 NOTE — H&P (Signed)
Date: 08/16/2017                Patient Name:  Nicholas Hodges MRN: [id number]  DOB: [birth year] Age / Sex: 77 y.o., male   PCP: Remi Haggard, FNP              Medical Service: Internal Medicine Teaching Service              Attending Physician: Dr. Lenice Pressman    First Contact: Annye Asa, MS4 Pager: [phone]  Second Contact: Dr. 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 History was obtained from the daughter and wife who report that patient woke up at 3 AM, and he began convulsing on both sides above the waist. He was unresponsive to stimuli throughout the entire duration, and his seizure did not stop until he was in the ambulance, after which he had one more short seizure. When he was awake in the ED, he was noted to have L-sided weakness and R sided deviation. He has received 1500 mg IV Keppra and 1 mg of Ativan thus far.   His last seizure was on 11/23, and he was transitioned to his current dose of Lamictal for treatment of his epilepsy on 11/26. He has not missed any doses for the past week, and he has not had any acute worsening of his mental status before this episode. At his baseline, he has dementia and requires assistance to walk. However, he is conversant and has no focal neurological deficits. He wears an adult diaper for chronic urine incontinence.   Meds:  Current Outpatient Medications  Medication Sig Dispense Refill  . aspirin EC 81 MG tablet Take 81 mg by mouth daily.    . Calcium Lactate 648 MG TABS Take 648 mg by mouth at bedtime.     Marland Kitchen ibuprofen (ADVIL,MOTRIN) 800 MG tablet Take 800 mg by mouth every 8 (eight) hours as  needed (pain).     Marland Kitchen lamoTRIgine (LAMICTAL) 100 MG tablet Take 1.5 tablets twice a day 90 tablet 11  . Melatonin 10 MG TABS Take 10 mg by mouth at bedtime.    Marland Kitchen omeprazole (PRILOSEC) 20 MG capsule Take 20 mg by mouth daily.     . Riboflavin (B-2) 100 MG TABS Take 100 mg by mouth daily.       Allergies: Allergies as of 08/16/2017  . (No Known Allergies)   Past Medical History:  Diagnosis Date  . Brain tumor (Grandfather)   . Epilepsy ([place])   . GERD (gastroesophageal reflux disease)   . Hyperlipidemia   . Hypertension   . Myocarditis ([place])   . S/P VP shunt   . Subacute bacterial endocarditis    Past Surgical History:  Procedure Laterality Date  . BRAIN TUMOR EXCISION    . foot surgery     Family History  Problem Relation Age of Onset  . Aneurysm Mother   . Dementia Sister   . Stomach cancer Sister   . Colon cancer Sister    Social History   Socioeconomic History  . Marital status: Married  Tobacco  Use  . Smoking status: Never Smoker  . Smokeless tobacco: Never Used  Substance and Sexual Activity  . Alcohol use: No  . Drug use: No  . Sexual activity: Not on file  Social History Narrative   Pt and wife live with daughter and her family (spouse and 2 adult children) in 2 story home   Highest level of education; 8th grade   Retired Administrator, sports for [employer]   Has 2 children    Review of Systems: Pertinent items noted in HPI and remainder of comprehensive ROS otherwise negative.  Physical Exam: Blood pressure (!) 88/45, pulse 71, temperature 97.7 F (36.5 C), resp. rate 16, height 5\' 6"  (1.676 m), weight 47.6 kg (105 lb), SpO2 100 %. BP (!) 81/55   Pulse 72   Temp 97.7 F (36.5 C)   Resp 12   Ht 5\' 6"  (1.676 m)   Wt 47.6 kg (105 lb)   SpO2 98%   BMI 16.95 kg/m   General Appearance:    Asleep in bed, in no acute distress, appears stated age. Does not awake to conversation in the room or during exam.   Head:    Normocephalic, without obvious  abnormality. Nasal cannula in place.   Lungs:     Clear to auscultation bilaterally on the anterior chest wall, respirations unlabored  Chest wall:    No tenderness or deformity. Numerous skin tags on the lower neck and chest.   Heart:   Tachycardic, regular rhythm, S1 and S2 normal, no murmur, rub or gallop  Abdomen:     Soft, non-tender, no masses  Extremities:   Extremities normal, atraumatic, no cyanosis or edema. 2+ dorsalis pedis pulses bilaterally  Neurologic:   Does not awake to conversation in the room. Made a few incomprehensible sounds after being examined. Remainder of neuro exam deferred secondary to mental status    Lab results: Na - 137, K - 4.5, Cl - 111, CO2 - 21 (L), Glucose - 99, Cr - 0.96, Calcium - 7.5   AST - 25, ALT - 8 (L)   Imaging results:  Ct Head Wo Contrast Result Date: 08/16/2017  FINDINGS:  CT HEAD Brain: Chronic generalized atrophy. Occipital craniectomy with an apparently defunct shunt catheter in that region, extending through a right parietal burr hole. This enters the right lateral ventricle and extends into the temporal horn. Chronic midline cerebellar atrophy/ encephalomalacia appears the same. Ventricular size is the same. Chronic encephalomalacia of the right temporal lobe is the same. Vascular clips again evident in the anterior middle cranial fossa. No sign of acute infarction, mass lesion or hemorrhage.  Vascular: There is atherosclerotic calcification of the major vessels at the base of the brain.  Skull: Extensive craniotomy changes posterior fossa and right pterional.  Sinuses/Orbits: No significant sinus inflammatory disease. Few opacified ethmoid air cells. As noted above, CTA of the head neck was unsuccessful.   IMPRESSION: No acute brain finding. Extensive posterior fossa an right middle cranial fossa postoperative changes with brain encephalomalacia in those areas. Chronic ventriculomegaly which has not changed. Nonfunctional shunt tube.      MRI HEAD Result Date: 08/16/2017  FINDINGS  Brain: Diffusion imaging does not show any acute or subacute infarction or other cause of restricted diffusion. There has been previous suboccipital craniectomy. 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 Chronic cerebellar atrophy with encephalomalacia of the vermis. Chronic brainstem atrophy. Cerebral hemispheres show shunt tube entering the posterior body of the right lateral ventricle  and passing into the right temporal horn. There is pronounced generalized cerebral atrophy, with asymmetric atrophy encephalomalacia of the right temporal lobe. Ventricular size is prominent but consistent with ex vacuo enlargement. This is stable by CT. No sign of neoplastic mass lesion. No sign of acute hemorrhage. Few scattered foci of hemosiderin deposition. No evidence of subdural collection. Vascular: Major vessels at the base of the brain show flow. Skull and upper cervical spine: Right pterional craniotomy. Suboccipital craniectomy.  Sinuses/Orbits: Sinuses are clear. Orbits are negative. Previous lens implant on the right.  Other: None    MRA HEAD   FINDINGS Both internal carotid arteries are patent through the skullbase and siphon regions. Anterior and middle cerebral vessels show flow. Due to motion degradation, medium to small vessel detail is poor. Both vertebral arteries are patent to the basilar. No basilar stenosis. Major posterior circulation branch vessels are patent. Again, there is considerable motion degradation.  IMPRESSION: No acute intracranial finding. Previous right pterional craniotomy. Previous suboccipital craniectomy with chronic pseudomeningocele. Pronounced atrophy/ postop encephalomalacia of the vermis. Pronounced atrophy/ postoperative encephalomalacia of the right temporal lobe. Generalized brain atrophy elsewhere. Shunt tube in place with chronic ventriculomegaly. Limited detail MR angiography because of  motion shows patency of the large and medium size vessels.    Dg Chest Portable 1 View Result Date: 08/16/2017  FINDINGS: Chronic elevation of right hemidiaphragm. Adjacent right basilar atelectasis. A skin fold projects over the right upper chest. Heart is normal in size. Unchanged mediastinal contours. No confluent consolidation. No pleural effusion. Bones are under mineralized.  IMPRESSION: Chronic elevation of right hemidiaphragm. Right lung base atelectasis or scarring.    Other results: EKG (1/7): Sinus tachycardia without ST elevations, interval prolongations.   Assessment & Plan by Problem: Pt is a 77yo male with a hx of dementia, epilepsy, and remote hx of brain cancer s/p multiple craniotomies and VP shunt placement, who presented to the ED after an episode of status epilepticus with altered mental status and L hemiplegia. 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   1. L hemiplegia An ischemic or hemorrhagic stroke has been ruled out with brain imaging, and the acute onset of the weakness after the seizure is suggestive of Todd's paralysis. Other conditions on the differential include migraine (unlikely as  the patient had not reported a headache), Guillain-Barre (unlikely as his presentation is more acute), and multiple sclerosis (although this was not viewed on imaging and would require additional neurological deficits in the future to make the diagnosis).  - Will continue to monitor for improvement in the physical exam over the next 48 hours.    2. Altered mental status Pt's acute obtunded mental status is likely secondary to recent doses of Keppra and Ativan. He is also requiring 4 L of oxygen when he is not requiring any at home. His mental status has remained altered for several hours after his seizure activity has presumably ended, making this  unlikely to be a postictal state. This is also unlikely to be an ongoing seizure, as his EEG does not reflect any ongoing seizure activity A brain hemorrhage has been ruled out with imaging. His Phenytoin level is not elevated as it was during his last visit to the ED in October. Lamotrigine toxicity is also on the differential. Pt has dementia when at his baseline mental status - Plan to wean  oxygen when mental status has improved - Bedside stroke swallow has been ordered before regular diet and oral meds are resumed   3. Status epilepticus  As this is the patient's first seizure event with his current medication regimen, a Lamotrigine level is pending. Pt s family does not report any missed doses. Pt has not recently vomited, causing him to miss a dose or have any electrolyte abnormalities as a cause of his seizure. ECG does not show any arrhythmia that would reflect cardiogenic syncope and hypoxia as a cause. 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  - EEG does not show any ongoing seizure activity - S/p 1500 mg IV Keppra and 1 mg Ativan  - Will continue with IV Keppra 500 mg BID in the hospital and discharge with prescription for 200 mg BID Lamictal per Neurology.    4. GERD - Hold home Omeprazole PO here in the hospital until his mental status has improved.    Fluids: S/p 1 L 0.9% NS bolus.  Electrolytes: Within normal limits (1/7).  Nutrition: NPO pending bedside stroke swallow.  PPx: Holding PO Omeprazole for GERD. Lovenox on board.    This is a Careers information officer Note.  The care of the patient was discussed with Dr. Danford Bad, and the assessment and plan was formulated with their assistance.  Please see their note for official documentation of the patient encounter.   Signed: Annye Asa, Medical Student 08/16/2017, 3:11 PM

## 2017-08-16 NOTE — Consult Note (Signed)
Pt had 54ml Iso 370 extravasation in rt upper arm in IV placed by Ultrasound by ED MD. Dr. Marisue Humble assessed pt, IV was removed, arm was elevated and heat was placed on site. Verbal instructions were given to the PT and RN, verbal handoff given to RN, discharge orders were placed.

## 2017-08-16 NOTE — ED Notes (Signed)
Unable to draw labs will update MD

## 2017-08-16 NOTE — ED Notes (Signed)
EEG at bedside.

## 2017-08-16 NOTE — ED Notes (Signed)
Tomi Bamberger, MD aware of inability to draw labs, this RN attempted x2, 2nd RN to attempt blood draw, Tamala Julian, Neuro PA at bedside

## 2017-08-16 NOTE — Progress Notes (Signed)
Family at [facility] requesting to speak with a dr; paged on call.

## 2017-08-16 NOTE — ED Provider Notes (Signed)
[facility] EMERGENCY DEPARTMENT Provider Note   CSN: [id number] Arrival date & time: 08/16/17  3570     History   Chief Complaint Chief Complaint  Patient presents with  . Seizures   Level 5 caveat: Dementia  HPI Nicholas Hodges is a 77 y.o. male.  HPI Patient is a 77 year old man who is brought to the emergency department after reported prolonged seizure.  Patient is on Lamictal.  He is followed by Dr. Delice Lesch.  Reported recent compliance with medications.  His been his normal state of health.  History of dementia therefore cannot provide significant history at this time.  Presents with his eyes deviated to the right and hemiplegic on the left side.  Last seen normal approximately 6 hours ago.  Patient underwent craniotomies in the 1950s and has a VP shunt from the 1950s.   Past Medical History:  Diagnosis Date  . Brain tumor ([place])   . Epilepsy ([place])   . GERD (gastroesophageal reflux disease)   . Hyperlipidemia   . Hypertension   . Myocarditis ([place])   . S/P VP shunt   . Subacute bacterial endocarditis     Patient Active Problem List   Diagnosis Date Noted  . Localization-related (focal) (partial) symptomatic epilepsy and epileptic syndromes with complex partial seizures, not intractable, without status epilepticus ([place]) 04/13/2017  . History of brain tumor 04/13/2017    Past Surgical History:  Procedure Laterality Date  . BRAIN TUMOR EXCISION    . foot surgery         Home Medications    Prior to Admission medications   Medication Sig Start Date End Date Taking? Authorizing Provider  aspirin EC 81 MG tablet Take 81 mg by mouth daily.    [provider]  Calcium Lactate 648 MG TABS Take 648 mg by mouth at bedtime.     [provider]  ibuprofen (ADVIL,MOTRIN) 800 MG tablet Take 800 mg by mouth every 8 (eight) hours as needed (pain).     [provider]  lamoTRIgine (LAMICTAL) 100 MG tablet Take 1.5 tablets twice a day  07/05/17   Cameron Sprang, MD  Melatonin 10 MG TABS Take 10 mg by mouth at bedtime.    [provider]  omeprazole (PRILOSEC) 20 MG capsule Take 20 mg by mouth daily.  04/05/17   [provider]  Riboflavin (B-2) 100 MG TABS Take 100 mg by mouth daily.     [provider]    Family History Family History  Problem Relation Age of Onset  . Aneurysm Mother   . Dementia Sister   . Stomach cancer Sister   . Colon cancer Sister     Social History Social History   Tobacco Use  . Smoking status: Never Smoker  . Smokeless tobacco: Never Used  Substance Use Topics  . Alcohol use: No  . Drug use: No     Allergies   Patient has no known allergies.   Review of Systems Review of Systems  Unable to perform ROS: Mental status change     Physical Exam Updated Vital Signs BP 92/60 (BP Location: Right Arm)   Pulse (!) 104   Resp 16   Ht 5\' 6"  (1.676 m)   Wt 47.6 kg (105 lb)   SpO2 93%   BMI 16.95 kg/m   Physical Exam  HENT:  Head: Normocephalic and atraumatic.  Eyes: EOM are normal.  Neck: Normal range of motion.  Cardiovascular: Normal rate, regular  rhythm and intact distal pulses.  Pulmonary/Chest: Effort normal and breath sounds normal. No respiratory distress.  Abdominal: He exhibits no distension. There is no tenderness.  Musculoskeletal: Normal range of motion.  Neurological:  Opens eyes to voice.  Hemiplegic on the left side.  Normal strength of the right upper extremity right lower extremity.  Skin: Skin is warm and dry.  Nursing note and vitals reviewed.    ED Treatments / Results  Labs (all labs ordered are listed, but only abnormal results are displayed) Labs Reviewed  CBC  COMPREHENSIVE METABOLIC PANEL  PHENYTOIN LEVEL, TOTAL  I-STAT CHEM 8, ED  CBG MONITORING, ED    EKG  EKG Interpretation  Date/Time:  Monday August 16 2017 04:52:51 EST Ventricular Rate:  102 PR Interval:    QRS Duration: 94 QT Interval:  379 QTC  Calculation: 494 R Axis:   75 Text Interpretation:  Sinus tachycardia Probable left atrial enlargement Nonspecific repol abnormality, diffuse leads No significant change was found Confirmed by Jola Schmidt [id number]) on 08/16/2017 5:34:17 AM Also confirmed by Jola Schmidt [phone]), editor Hattie Perch ([id number])  on 08/16/2017 7:01:18 AM       Radiology Dg Chest Portable 1 View  Result Date: 08/16/2017 CLINICAL DATA:  Seizure.  Altered mental status.  Hypoxia. EXAM: PORTABLE CHEST 1 VIEW COMPARISON:  04/04/2017 FINDINGS: Chronic elevation of right hemidiaphragm. Adjacent right basilar atelectasis. A skin fold projects over the right upper chest. Heart is normal in size. Unchanged mediastinal contours. No confluent consolidation. No pleural effusion. Bones are under mineralized. IMPRESSION: Chronic elevation of right hemidiaphragm. Right lung base atelectasis or scarring. Electronically Signed   By: Jeb Levering M.D.   On: 08/16/2017 05:23    Procedures Procedures (including critical care time)  Medications Ordered in ED Medications  iopamidol (ISOVUE-370) 76 % injection (not administered)     Initial Impression / Assessment and Plan / ED Course  I have reviewed the triage vital signs and the nursing notes.  Pertinent labs & imaging results that were available during my care of the patient were reviewed by me and considered in my medical decision making (see chart for details).     Stat head CT demonstrated no significant abnormality except for chronic hydrocephalus.  Attempted to perform CT angios head neck to evaluate for large vessel occlusion.  Ultrasound-guided right upper extremity IV placed without difficulty and flushed well multiple times for myself and the CT tech.  Unfortunately during contrast delivery while in imaging he had an extravasation.  No significant swelling noted in his right upper extremity.  Compartments are soft.  I discussed the case briefly with neurology who  recommended stat MRI MRA head.  This demonstrated no significant abnormality.  Repeat evaluation demonstrates some movement in the left upper extremity and this may represent Todd's paralysis.  Patient be admitted the hospital for ongoing workup and management.  Neurology consultation is appreciated.  Final Clinical Impressions(s) / ED Diagnoses   Final diagnoses:  None    ED Discharge Orders    None       Jola Schmidt, MD 08/16/17 [phone]

## 2017-08-16 NOTE — Procedures (Signed)
History: 77 yo M with persistent left sided weakness after seizure  Sedation: None  Technique: This is a 21 channel routine scalp EEG performed at the bedside with bipolar and monopolar montages arranged in accordance to the international 10/20 system of electrode placement. One channel was dedicated to EKG recording.    Background: There is a posterior dominant rhythm of 8.5 Hz seen well on the left side.  On the right, there is generalized irregular delta and theta activity with the posterior dominant rhythm being less well-formed, though it is seen at times. Following Ativan administration, there is improvement in muscle artifact, sleep structures are seen on the left, with attenuation of faster frequencies on the right.  There are occasional sharp wave discharges arising from the right temporal region without evolution.   Photic stimulation: Physiologic driving is not performed  EEG Abnormalities: 1) Right temporal sharp waves.  2) attenuation of faster frequencies in the right 3) generalized irregular slow activity on the right  Clinical Interpretation: This EEG is consistent with a nonspecific cerebral dysfunction wide spread in the right hemisphere.   There was no seizure or seizure predisposition recorded on this study. Please note that a normal EEG does not preclude the possibility of epilepsy.   Roland Rack, MD [facility] [phone]  If 7pm- 7am, please page neurology on call as listed in [place].

## 2017-08-16 NOTE — ED Notes (Signed)
Admitting paged to RN Jenny Reichmann per her request

## 2017-08-16 NOTE — Progress Notes (Signed)
STAT EEG completed; results pending. Dr Kirkpatrick notified. 

## 2017-08-17 ENCOUNTER — Other Ambulatory Visit: Payer: Self-pay

## 2017-08-17 ENCOUNTER — Encounter (HOSPITAL_COMMUNITY): Payer: Self-pay

## 2017-08-17 DIAGNOSIS — Z87820 Personal history of traumatic brain injury: Secondary | ICD-10-CM | POA: Diagnosis not present

## 2017-08-17 DIAGNOSIS — G40201 Localization-related (focal) (partial) symptomatic epilepsy and epileptic syndromes with complex partial seizures, not intractable, with status epilepticus: Secondary | ICD-10-CM | POA: Diagnosis present

## 2017-08-17 DIAGNOSIS — F0390 Unspecified dementia without behavioral disturbance: Secondary | ICD-10-CM | POA: Diagnosis present

## 2017-08-17 DIAGNOSIS — Z79899 Other long term (current) drug therapy: Secondary | ICD-10-CM | POA: Diagnosis not present

## 2017-08-17 DIAGNOSIS — E785 Hyperlipidemia, unspecified: Secondary | ICD-10-CM | POA: Diagnosis present

## 2017-08-17 DIAGNOSIS — Z982 Presence of cerebrospinal fluid drainage device: Secondary | ICD-10-CM | POA: Diagnosis not present

## 2017-08-17 DIAGNOSIS — K219 Gastro-esophageal reflux disease without esophagitis: Secondary | ICD-10-CM | POA: Diagnosis present

## 2017-08-17 DIAGNOSIS — G40209 Localization-related (focal) (partial) symptomatic epilepsy and epileptic syndromes with complex partial seizures, not intractable, without status epilepticus: Secondary | ICD-10-CM

## 2017-08-17 DIAGNOSIS — H5500 Unspecified nystagmus: Secondary | ICD-10-CM | POA: Diagnosis present

## 2017-08-17 DIAGNOSIS — I10 Essential (primary) hypertension: Secondary | ICD-10-CM | POA: Diagnosis present

## 2017-08-17 DIAGNOSIS — Z9889 Other specified postprocedural states: Secondary | ICD-10-CM | POA: Diagnosis not present

## 2017-08-17 DIAGNOSIS — G40901 Epilepsy, unspecified, not intractable, with status epilepticus: Secondary | ICD-10-CM | POA: Diagnosis not present

## 2017-08-17 DIAGNOSIS — Z23 Encounter for immunization: Secondary | ICD-10-CM | POA: Diagnosis not present

## 2017-08-17 DIAGNOSIS — H518 Other specified disorders of binocular movement: Secondary | ICD-10-CM | POA: Diagnosis present

## 2017-08-17 DIAGNOSIS — Z85841 Personal history of malignant neoplasm of brain: Secondary | ICD-10-CM | POA: Diagnosis not present

## 2017-08-17 DIAGNOSIS — R569 Unspecified convulsions: Secondary | ICD-10-CM | POA: Diagnosis present

## 2017-08-17 DIAGNOSIS — G8194 Hemiplegia, unspecified affecting left nondominant side: Secondary | ICD-10-CM

## 2017-08-17 DIAGNOSIS — R4182 Altered mental status, unspecified: Secondary | ICD-10-CM | POA: Diagnosis not present

## 2017-08-17 DIAGNOSIS — G8384 Todd's paralysis (postepileptic): Secondary | ICD-10-CM | POA: Diagnosis present

## 2017-08-17 DIAGNOSIS — Z7982 Long term (current) use of aspirin: Secondary | ICD-10-CM | POA: Diagnosis not present

## 2017-08-17 LAB — COMPREHENSIVE METABOLIC PANEL
ALT: 11 U/L — ABNORMAL LOW (ref 17–63)
AST: 30 U/L (ref 15–41)
Albumin: 2.4 g/dL — ABNORMAL LOW (ref 3.5–5.0)
Alkaline Phosphatase: 86 U/L (ref 38–126)
Anion gap: 6 (ref 5–15)
BUN: 9 mg/dL (ref 6–20)
CO2: 22 mmol/L (ref 22–32)
Calcium: 7.9 mg/dL — ABNORMAL LOW (ref 8.9–10.3)
Chloride: 112 mmol/L — ABNORMAL HIGH (ref 101–111)
Creatinine, Ser: 0.78 mg/dL (ref 0.61–1.24)
GFR calc Af Amer: >60 mL/min (ref >60)
GFR calc non Af Amer: >60 mL/min (ref >60)
Glucose, Bld: 86 mg/dL (ref 65–99)
Potassium: 3.8 mmol/L (ref 3.5–5.1)
Sodium: 140 mmol/L (ref 135–145)
Total Bilirubin: 0.5 mg/dL (ref 0.3–1.2)
Total Protein: 4.8 g/dL — ABNORMAL LOW (ref 6.5–8.1)

## 2017-08-17 LAB — CBC WITH DIFFERENTIAL/PLATELET
Basophils Absolute: 0 10*3/uL (ref 0.0–0.1)
Basophils Relative: 0 %
Eosinophils Absolute: 0.1 10*3/uL (ref 0.0–0.7)
Eosinophils Relative: 2 %
HCT: 37.5 % — ABNORMAL LOW (ref 39.0–52.0)
Hemoglobin: 11.8 g/dL — ABNORMAL LOW (ref 13.0–17.0)
Lymphocytes Relative: 21 %
Lymphs Abs: 1.4 10*3/uL (ref 0.7–4.0)
MCH: 30.9 pg (ref 26.0–34.0)
MCHC: 31.5 g/dL (ref 30.0–36.0)
MCV: 98.2 fL (ref 78.0–100.0)
Monocytes Absolute: 0.6 10*3/uL (ref 0.1–1.0)
Monocytes Relative: 9 %
Neutro Abs: 4.6 10*3/uL (ref 1.7–7.7)
Neutrophils Relative %: 68 %
Platelets: 268 10*3/uL (ref 150–400)
RBC: 3.82 MIL/uL — ABNORMAL LOW (ref 4.22–5.81)
RDW: 14 % (ref 11.5–15.5)
WBC: 6.7 10*3/uL (ref 4.0–10.5)

## 2017-08-17 MED ORDER — PNEUMOCOCCAL VAC POLYVALENT 25 MCG/0.5ML IJ INJ
0.5000 mL | INJECTION | INTRAMUSCULAR | Status: AC
Start: 2017-08-18 — End: 2017-08-18
  Administered 2017-08-18: 0.5 mL via INTRAMUSCULAR
  Filled 2017-08-17: qty 0.5

## 2017-08-17 MED ORDER — LAMOTRIGINE 100 MG PO TABS
150.0000 mg | ORAL_TABLET | Freq: Two times a day (BID) | ORAL | Status: DC
  Administered 2017-08-17: 150 mg via ORAL
  Filled 2017-08-17: qty 2

## 2017-08-17 NOTE — Progress Notes (Signed)
  Date: 08/17/2017  Patient name: Nicholas Hodges  Medical record number: [id number]  Date of birth: 28-Apr-1941   I saw and evaluated the patient. I reviewed the resident's note and I agree with the resident's findings and plan as documented in the resident's note.  Chief Complaint(s): seizure  History - key components related to admission:  Please see resident note for details.  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 Seizures resolved after administration of multiple doses of benzos. He was initially noted to have hemiplegia and eye deviation.  Today, he reports feeling mostly back to baseline, except for some left leg "soreness", which presents him from moving the leg.  Physical Exam - key components related to admission:  Vitals:   08/17/17 0550 08/17/17 0932 08/17/17 1341 08/17/17 1501  BP: (!) 97/48 (!) 97/49 (!) 113/55 (!) 106/50  Pulse: 70 79 84 84  Resp: 16 16 16 16   Temp: 97.8 F (36.6 C) 98.2 F (36.8 C) 98.3 F (36.8 C) 99 F (37.2 C)  TempSrc: Oral Oral Oral Oral  SpO2: 97% 96% 96% 95%  Weight:      Height:        General: Alert, pleasant, not in distress HEENT: Moist mucous membranes Neck: No JVD, no lymphadenopathy Cardiovascular: Regular rate and rhythm, no murmurs rubs or gallops Pulmonary: Clear to auscultation bilaterally, normal work of breathing Abdominal: Soft, nontender, not distended, normal bowel sounds Extremities: Warm, no edema Skin: No significant rashes or lesions Neuro: Alert and interactive, cognitive impairment evident, but able to converse normally, PERRL and EOMI, face symmetric, tongue protrudes midline, strength intact except for left lower extremity, which she had difficulty moving, but unclear if due to true weakness or limited cooperation due to "soreness"  Assessment and Plan: I have  seen and evaluated the patient as outlined in the resident's note. 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 Hemiplegia: Has primarily resolved today, except for possible residual left leg weakness. Reportedly, on neuro exam later by the neurology team, leg weakness had resolved. Imaging negative for stroke or hemorrhage, most likely Nicholas paralysis due to seizure. Seizure management as below, but no other acute management needed at this time  2. Seizure: Breakthrough seizure yesterday despite reported adherence to home lamotrigine. Per discussion with neurology, added Keppra yesterday for additional coverage until we are able to increase his lamotrigine.  As soon as we have titrated his AEDs, he should be ready for discharge.  Oda Kilts, MD 1/8/20193:54 PM

## 2017-08-17 NOTE — Progress Notes (Signed)
Subjective: Patient has no complaints.  Wife at bedside feels he is 98% back to his baseline.  Currently he is now following simple commands, moving all extremities, and has no gaze preference.  Wife did mention that husband has been extremely anxious and possibly not sleeping well secondary to her pancreatic cancer.  Exam: Vitals:   08/17/17 0100 08/17/17 0550  BP: (!) 105/51 (!) 97/48  Pulse: 80 70  Resp: 16 16  Temp: 98.3 F (36.8 C) 97.8 F (36.6 C)  SpO2: 99% 97%    HEENT-  Normocephalic, no lesions, without obvious abnormality.  Normal external eye and conjunctiva.  Normal TM's bilaterally.  Normal auditory canals and external ears. Normal external nose, mucus membranes and septum.  Normal pharynx. Cardiovascular- S1, S2 normal, pulses palpable throughout   Lungs- chest clear, no wheezing, rales, normal symmetric air entry Abdomen- normal findings: bowel sounds normal Extremities- no edema Lymph-no adenopathy palpable Musculoskeletal-no joint tenderness, deformity or swelling Skin-warm and dry, no hyperpigmentation, vitiligo, or suspicious lesions   Neuro:  Patient is alert, he knows he is at the hospital but cannot tell me which hospital, when asked the date he said it was [birth year] which apparently is the birth year of his sister, he is able to follow commands such as squeezing my hand and raising his arms.  Patient is able to name my thumb, pinky, watch. CN: Pupils are equal and round. They are symmetrically reactive from 3-->2 mm. EOMI without nystagmus. Facial sensation is intact to light touch. Face is symmetric at rest with normal strength and mobility. Hearing is intact to conversational voice. Palate elevates symmetrically and uvula is midline. Voice is normal in tone, pitch and quality. Bilateral SCM and trapezii are 5/5. Tongue is midline with normal bulk and mobility.  Motor:  Moving all extremities antigravity, left leg a little less so secondary to some  discomfort. Sensation: Intact to light touch.  DTRs: 2+, symmetric bilateral upper extremities, 1+ at the patella and no Achilles.   Medications:  Prior to Admission:  Medications Prior to Admission  Medication Sig Dispense Refill Last Dose  . aspirin EC 81 MG tablet Take 81 mg by mouth daily.   08/15/2017 at 1000  . Calcium Lactate 648 MG TABS Take 648 mg by mouth at bedtime.    08/15/2017 at Unknown time  . ibuprofen (ADVIL,MOTRIN) 800 MG tablet Take 800 mg by mouth every 8 (eight) hours as needed (pain).    unk  . lamoTRIgine (LAMICTAL) 100 MG tablet Take 1.5 tablets twice a day 90 tablet 11 08/15/2017 at Unknown time  . Melatonin 10 MG TABS Take 10 mg by mouth at bedtime.   08/15/2017 at Unknown time  . omeprazole (PRILOSEC) 20 MG capsule Take 20 mg by mouth daily.    08/15/2017 at Unknown time  . Riboflavin (B-2) 100 MG TABS Take 100 mg by mouth daily.    08/15/2017 at Unknown time   Scheduled: . aspirin EC  81 mg Oral Daily  . enoxaparin (LOVENOX) injection  40 mg Subcutaneous Q24H  . Melatonin  9 mg Oral QHS  . pantoprazole  40 mg Oral Daily  . [START ON 08/18/2017] pneumococcal 23 valent vaccine  0.5 mL Intramuscular Tomorrow-1000   Continuous: . sodium chloride 75 mL/hr at 08/17/17 0631  . levETIRAcetam Stopped (08/16/17 2310)   JHE:RDEYCXKGYJEHU **OR** acetaminophen, senna-docusate  Pertinent Labs/Diagnostics:  EEG Abnormalities:  1) Right temporal sharp waves.  2) attenuation of faster frequencies in the right 3) generalized irregular  slow activity on the right  Clinical Interpretation: This EEG is consistent with a nonspecific cerebral dysfunction wide spread in the right hemisphere.   There was no seizure or seizure predisposition recorded on this study. Please note that a normal EEG does not preclude the possibility of epilepsy.      Ct Angio Neck W And/or Wo Contrast--was unsucessful IMPRESSION: No acute brain finding. Extensive posterior fossa an right middle cranial  fossa postoperative changes with brain encephalomalacia in those areas. Chronic ventriculomegaly which has not changed. Nonfunctional shunt tube. Electronically Signed   By: Nelson Chimes M.D.   On: 08/16/2017 06:45   Mr Angiogram Head Wo Contrast  Mr Brain Wo Contrast  IMPRESSION: No acute intracranial finding. Previous right pterional craniotomy. Previous suboccipital craniectomy with chronic pseudomeningocele. Pronounced atrophy/ postop encephalomalacia of the vermis. Pronounced atrophy/ postoperative encephalomalacia of the right temporal lobe. Generalized brain atrophy elsewhere. Shunt tube in place with chronic ventriculomegaly. Limited detail MR angiography because of motion shows patency of the large and medium size vessels. Electronically Signed   By: Nelson Chimes M.D.   On: 08/16/2017 07:33   Etta Quill PA-C [facility] [phone]  Impression: 77 yo M with breakthrough seizure with prolonged todds phenomenon. He is markedly improved today.    Recommendations: 1) consider PT if still complaining of difficulty walking  2) Restart home lamotrigine, can increase to 175mg  BID.  3) will follow.   Roland Rack, MD [facility] [phone]  If 7pm- 7am, please page neurology on call as listed in [place].  08/17/2017, 9:05 AM

## 2017-08-17 NOTE — Progress Notes (Signed)
Subjective: No acute events overnight. The pt was awake and conversant this morning. He understands that he had a seizure, but does not exactly remember what happened yesterday. His wife was at the bedside, and she says that his mental status improved over the last 24 hours. Although he does have dementia, she does not think he is quite back to his baseline with his memory. He has had no difficulty moving his arms today, but he is unable to lift his L leg against gravity. He describes a soreness in his L knee cap whenever he attempts to lift it. He also mentioned that his sinuses are bothering him.  Objective: Vital signs in last 24 hours: Vitals:   08/16/17 2130 08/16/17 2323 08/17/17 0100 08/17/17 0550  BP: (!) 83/48 91/71 (!) 105/51 (!) 97/48  Pulse: 70 77 80 70  Resp: 16  16 16   Temp: 97.6 F (36.4 C)  98.3 F (36.8 C) 97.8 F (36.6 C)  TempSrc: Oral  Oral Oral  SpO2: 96%  99% 97%  Weight:      Height:        Intake/Output Summary (Last 24 hours) at 08/17/2017 0827 Last data filed at 08/17/2017 0631 Gross per 24 hour  Intake 1982.5 ml  Output 900 ml  Net 1082.5 ml   BP (!) 97/48   Pulse 70   Temp 97.8 F (36.6 C) (Oral)   Resp 16   Ht 5\' 6"  (1.676 m)   Wt 47.6 kg (105 lb)   SpO2 97%   BMI 16.95 kg/m   General Appearance:    Alert, cooperative, no distress, appears stated age  Head:    Normocephalic, without obvious abnormality, atraumatic, nasal cannula in place  Eyes:    EOM's intact without nystagmus.   Lungs:     Clear to auscultation bilaterally on anterior chest wall, respirations unlabored  Chest wall:    No tenderness or deformity. Numerous skin tags on the lower neck and chest   Heart:    Regular rate and rhythm, S1 and S2 normal, no murmur, rub   or gallop  Abdomen:     Soft, non-tender in all 4 quadrants  Extremities:   Extremities normal, no cyanosis or edema. No tenderness to palpation over L hip.   Neurologic:   Awake, conversant. Long term memory intact,  but short term memory impaired.  Upper extremity strength 5/5 bilaterally. Normal finger strength bilaterally. RLE 5/5 strength. Unable to lift LLE against gravity.    Lab Results: CBC     Status: Abnormal   Collection Time: 08/16/17 11:27 PM  Result Value Ref Range   WBC 7.3 4.0 - 10.5 K/uL   RBC 4.01 (L) 4.22 - 5.81 MIL/uL   Hemoglobin 12.4 (L) 13.0 - 17.0 g/dL   HCT 39.4 39.0 - 52.0 %   MCV 98.3 78.0 - 100.0 fL   MCH 30.9 26.0 - 34.0 pg   MCHC 31.5 30.0 - 36.0 g/dL   RDW 14.0 11.5 - 15.5 %   Platelets 252 150 - 400 K/uL  CBC with Differential/Platelet     Status: Abnormal   Collection Time: 08/17/17  4:25 AM  Result Value Ref Range   WBC 6.7 4.0 - 10.5 K/uL   RBC 3.82 (L) 4.22 - 5.81 MIL/uL   Hemoglobin 11.8 (L) 13.0 - 17.0 g/dL   HCT 37.5 (L) 39.0 - 52.0 %   MCV 98.2 78.0 - 100.0 fL   MCH 30.9 26.0 - 34.0 pg   MCHC  31.5 30.0 - 36.0 g/dL   RDW 14.0 11.5 - 15.5 %   Platelets 268 150 - 400 K/uL   Neutro Abs 4.6 1.7 - 7.7 K/uL   Lymphs Abs 1.4 0.7 - 4.0 K/uL   Monocytes Absolute 0.6 0.1 - 1.0 K/uL   Eosinophils Absolute 0.1 0.0 - 0.7 K/uL   Basophils Absolute 0.0 0.0 - 0.1 K/uL  Comprehensive metabolic panel     Status: Abnormal   Collection Time: 08/17/17  4:25 AM  Result Value Ref Range   Sodium 140 135 - 145 mmol/L   Potassium 3.8 3.5 - 5.1 mmol/L   Chloride 112 (H) 101 - 111 mmol/L   CO2 22 22 - 32 mmol/L   Glucose, Bld 86 65 - 99 mg/dL   BUN 9 6 - 20 mg/dL   Creatinine, Ser 0.78 0.61 - 1.24 mg/dL   Calcium 7.9 (L) 8.9 - 10.3 mg/dL   Total Protein 4.8 (L) 6.5 - 8.1 g/dL   Albumin 2.4 (L) 3.5 - 5.0 g/dL   AST 30 15 - 41 U/L   ALT 11 (L) 17 - 63 U/L   Alkaline Phosphatase 86 38 - 126 U/L   Total Bilirubin 0.5 0.3 - 1.2 mg/dL   GFR calc non Af Amer >60 >60 mL/min   GFR calc Af Amer >60 >60 mL/min   Anion gap 6 5 - 15     Micro Results: No results found for this visit.   Studies/Results: No new diagnostic studies performed since admission.   Medications: I  have reviewed the patient's current medications. Scheduled Meds: . aspirin EC  81 mg Oral Daily  . enoxaparin (LOVENOX) injection  40 mg Subcutaneous Q24H  . Melatonin  9 mg Oral QHS  . pantoprazole  40 mg Oral Daily  . [START ON 08/18/2017] pneumococcal 23 valent vaccine  0.5 mL Intramuscular Tomorrow-1000   Continuous Infusions: . sodium chloride 75 mL/hr at 08/17/17 0631  . 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  1. Todd's Paralysis  - Pt is no longer showing any nystagmus or R-sided deviation. He is able to move his L arm, but he is unable to move his L leg due to knee soreness vs. Weakness. Unlikely to be due to a hip dislocation, pelvic fracture, or femur fracture as the pt is not reporting any pain at rest and is nontender to palpation.  - Will continue to evaluate for improvement in physical exam over the next 24 hours. - Pt is on total care, but can usually ambulate with assistance. Can consider PT to assist with ambulation before discharge.    2. Altered mental status - Significantly improved but not quite back to baseline with some memory impairments and cognitive delays - Passed the bedside stroke swallow - regular diet and oral meds can be resumed - Plan to wean oxygen as tolerated   3. Status epilepticus  - EEG ruled out any continued seizure activity on 1/7 - Will continue to monitor for any inpatient seizures and/or acute mental status changes - S/p 1500 mg IV Keppra and 1 mg Ativan  - Will continue with IV Keppra 500 mg BID in the hospital and  discharge with prescription for 200 mg BID Lamictal per  Neurology.    4. GERD - Continue Omeprazole PO here in the hospital   Fluids: S/p 1 L 0.9 % NS bolus and mIVF overnight Electrolytes: Within normal limits (1/8).  Nutrition: Thin fluids. Passed bedside stroke swallow  PPx:  Lovenox SQ Dispo: Discharge pending improvement in L leg movement   This is a Careers information officer Note.  The care of the patient was discussed with Dr. Danford Bad and Dr. Rebeca Alert and the assessment and plan formulated with their assistance.  Please see their attached note for official documentation of the daily encounter.   LOS: 0 days   Annye Asa, Medical Student 08/17/2017, 8:27 AM

## 2017-08-18 ENCOUNTER — Telehealth: Payer: Self-pay | Admitting: Neurology

## 2017-08-18 ENCOUNTER — Other Ambulatory Visit: Payer: Self-pay | Admitting: Neurology

## 2017-08-18 DIAGNOSIS — G40901 Epilepsy, unspecified, not intractable, with status epilepticus: Secondary | ICD-10-CM

## 2017-08-18 DIAGNOSIS — R4182 Altered mental status, unspecified: Secondary | ICD-10-CM

## 2017-08-18 LAB — LAMOTRIGINE LEVEL: Lamotrigine Lvl: 7.8 ug/mL (ref 2.0–20.0)

## 2017-08-18 MED ORDER — LAMOTRIGINE 100 MG PO TABS: 100.0000 mg | ORAL_TABLET | Freq: Two times a day (BID) | ORAL | 0 refills | Status: DC

## 2017-08-18 MED ORDER — LAMOTRIGINE 100 MG PO TABS: 200.0000 mg | ORAL_TABLET | Freq: Two times a day (BID) | ORAL | Status: DC

## 2017-08-18 MED ORDER — LAMOTRIGINE 25 MG PO TABS
175.0000 mg | ORAL_TABLET | Freq: Two times a day (BID) | ORAL | Status: DC
  Administered 2017-08-18: 11:00:00 175 mg via ORAL
  Filled 2017-08-18 (×2): qty 1

## 2017-08-18 MED ORDER — LAMOTRIGINE 200 MG PO TABS: 200.0000 mg | ORAL_TABLET | Freq: Two times a day (BID) | ORAL | 6 refills | Status: DC

## 2017-08-18 MED ORDER — LAMOTRIGINE 25 MG PO TABS: 75.0000 mg | ORAL_TABLET | Freq: Two times a day (BID) | ORAL | 0 refills | Status: DC

## 2017-08-18 NOTE — Telephone Encounter (Signed)
Returned call.  Spoke with pt's daughter's husband.  Pt scheduled to come in on Wednesday, Jan 16th.  Also made pt's son-in-law aware that Dr. Delice Lesch increased pt's Lamictal to 200mg  BID and that Rx's were sent over to the pharmacy.

## 2017-08-18 NOTE — Progress Notes (Signed)
Subjective: Improvement in left leg.   Exam: Vitals:   08/18/17 0113 08/18/17 0509  BP: 121/64 137/69  Pulse: 77 82  Resp: 18 18  Temp: 99.2 F (37.3 C) 99.5 F (37.5 C)  SpO2: 97% 95%   Gen: In bed, NAD Resp: non-labored breathing, no acute distress Abd: soft, nt  Neuro: MS: awake, alert, interactive and appropriate CN: PERRL, EOMI, VFF Motor: Greatly improved left sided weakness, back to essentially normal.  Sensory: intact to LT  Pertinent Labs: lamictal level pending.   Impression: 77 yo M with prolonged todd's following 20 minute seizure. He is currently back to baseline. I would favor increasing LTG, no need for keppra now that he is back on LTG.   Recommendations: 1) Increase lamotrigine to 175mg  IBD 2) d/c keppra 3) f/u with Dr. Delice Lesch.   Roland Rack, MD [facility] [phone]  If 7pm- 7am, please page neurology on call as listed in [place].

## 2017-08-18 NOTE — Care Management Note (Signed)
Case Management Note  Patient Details  Name: Nicholas Hodges MRN: [id number] Date of Birth: [birth year]  Subjective/Objective:   Pt in with Todds paralysis. He is from home with spouse.                 Action/Plan: Pt discharging home with self care. Pt has PCP, insurance and transportation home. No further needs per CM.   Expected Discharge Date:  08/18/17               Expected Discharge Plan:  Home/Self Care  In-House Referral:     Discharge planning Services     Post Acute Care Choice:    Choice offered to:     DME Arranged:    DME Agency:     HH Arranged:    HH Agency:     Status of Service:  Completed, signed off  If discussed at [employer] of Stay Meetings, dates discussed:    Additional Comments:  Pollie Friar, RN 08/18/2017, 1:31 PM

## 2017-08-18 NOTE — Progress Notes (Signed)
Subjective: No acute events overnight. Daughter is at the bedside, and she says that his mental status is back at his baseline dementia. He reports no difficulty moving his arms and legs this morning.   Objective: Vital signs in last 24 hours: Vitals:   08/17/17 1501 08/17/17 2039 08/18/17 0113 08/18/17 0509  BP: (!) 106/50 (!) 118/53 121/64 137/69  Pulse: 84 83 77 82  Resp: 16 18 18 18   Temp: 99 F (37.2 C) 99.3 F (37.4 C) 99.2 F (37.3 C) 99.5 F (37.5 C)  TempSrc: Oral Oral Oral Oral  SpO2: 95% 95% 97% 95%  Weight:      Height:        Intake/Output Summary (Last 24 hours) at 08/18/2017 0747 Last data filed at 08/18/2017 0510 Gross per 24 hour  Intake 600 ml  Output 1225 ml  Net -625 ml    General Appearance:    Alert, cooperative, no distress, appears stated age  Head:    Normocephalic, without obvious abnormality  Eyes:    EOM's intact   Lungs:     Clear to auscultation bilaterally on anterior chest wall, respirations unlabored  Chest wall:    No tenderness or deformity. Numerous skin tags on the lower neck and chest   Heart:    Regular rate and rhythm, S1 and S2 normal, no murmur, rub   or gallop  Extremities:   Extremities normal, no cyanosis or edema. Radial pulses 2+ bilaterally  Neurologic:   Awake, conversant. Strength 5/5 throughout all extremities. Normal finger grip strength bilaterally.    Lab Results: No lab results over the last 24 hours.   Micro Results: No results found for this visit.   Studies/Results: No new diagnostic studies performed since admission.   Medications: I have reviewed the patient's current medications. Scheduled Meds: . aspirin EC  81 mg Oral Daily  . enoxaparin (LOVENOX) injection  40 mg Subcutaneous Q24H  . lamoTRIgine  200 mg Oral BID  . Melatonin  9 mg Oral QHS  . pantoprazole  40 mg Oral Daily  . 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  1. Todd's Paralysis  - Pt is no longer showing any nystagmus or R-sided eye deviation. He is able to move his L arm and L leg this morning.  - Pt is on total care, but can usually ambulate with assistance.  - Can consider PT to assist with ambulation before discharge.    2. Altered mental status - Significantly improved since admission and back to his baseline per daughter's assessment - Passed the bedside stroke swallow - regular diet and oral meds can be resumed   3. Status epilepticus  - EEG ruled out any continued seizure activity on 1/7 - Will continue to monitor for any inpatient seizures and/or acute mental status changes - Will discontinue with IV Keppra 500 mg BID in the hospital and proceed with new home dosing of 175 mg BID Lamictal per Neurology.    4. GERD - Continue home Omeprazole PO    Fluids: None as pt can tolerate regular diet  Electrolytes: Within normal limits (1/8).  Nutrition: Regular diet. Passed bedside stroke swallow  PPx:  Lovenox SQ Dispo: Discharge today   This is a Careers information officer Note.  The care of the patient was discussed with Dr. Rebeca Alert and the assessment and plan formulated with their assistance.  Please see their attached note for official documentation of the daily encounter.   LOS: 1 day   Annye Asa, Medical Student 08/18/2017, 7:47 AM

## 2017-08-18 NOTE — Discharge Summary (Signed)
Name: Nicholas Hodges MRN: [id number] DOB: [birth year] 77 y.o. 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 Lamictal 175 mg BID 2. Aspirin 81 mg daily 3. Melatonin 9 mg qHS 4. Pantoprazole 40 mg daily    Medication List    TAKE these medications   aspirin EC 81 MG tablet Take 81 mg by mouth daily.   B-2 100 MG Tabs Take 100 mg by mouth daily.   Calcium Lactate 648 MG Tabs Take 648 mg by mouth at bedtime.   ibuprofen 800 MG tablet Commonly known as:  ADVIL,MOTRIN Take 800 mg by mouth every 8 (eight) hours as needed (pain).   lamoTRIgine 100 MG tablet Commonly known as:  LAMICTAL Take 1 tablet (100 mg total) by mouth 2 (two) times daily.   lamoTRIgine 25 MG tablet Commonly known as:  LAMICTAL Take 3 tablets (75 mg total) by mouth 2 (two) times daily.   Melatonin 10 MG Tabs Take 10 mg by mouth at bedtime.   omeprazole 20 MG capsule Commonly known as:  PRILOSEC Take 20 mg by mouth daily.     Disposition and follow-up:   Mr.Dmario W Yi is a 88yoM with a hx of epilepsy, s/p TBI, craniotomies, and VP shunt placement, who had a seizure for 30 minutes and was admitted for Todd's paralysis, and he was discharged from [facility] in good condition with all neurological deficits resolved. Please refer to full H&P on 08/18/17.  At the hospital follow up visit, please address his:  1.  Current dosing of Lamictal at 175 mg BID  Follow-up Appointments: Dr. Lars Masson (neurology) before January 31 - appointment to be made by daughter  Hospital Course by problem list:  1. Todd's Paralysis Mr. Skora was admitted on 1/7 after waking up at 3 AM and seizing for 30 minutes. He had one more short seizure in the ambulance, and when he woke up in the ED, he was noted to have R-sided eye deviation  and was unable to move his L arm and leg. A CT and MRA of his head ruled out any ischemic or hemorrhagic stroke. Apart from some soreness when moving his L leg, all of his neurological deficits had resolved before discharge on 1/9.   2. Epilepsy  Once arriving to the ED on 1/7, he was not noted to have any further convulsions. He received 1500 mg IV Keprpa and a dose of Ativan on the day of admission and was continued on 500 mg IV Keppra BID on 1/8. He was transitioned to Lamictal on 1/8, and was discharged at a higher dose of 175 mg PO Lamictal BID.   3. Altered mental status As a result of the treatment for his status epilepticus, Mr. Scioneaux was very somnolent on 1/7, but he was awake and conversant and almost back to his baseline level of dementia on 1/8. An EEG on 1/7 ruled out any ongoing seizure activity.    Discharge Vitals:   BP 137/69 (BP Location: Left Arm)   Pulse 82   Temp 99.5 F (37.5 C) (Oral)   Resp 18   Ht 5\' 6"  (1.676 m)   Wt 47.6 kg (105 lb)   SpO2 95%   BMI 16.95 kg/m   Pertinent Labs, Studies, and Procedures:  CT Head (1/7) - No acute brain finding. Extensive posterior  fossa an right middle cranial fossa postoperative changes with brain encephalomalacia in those areas. Chronic ventriculomegaly which has not changed. Nonfunctional shunt tube.   MRI Head (1/7) - No acute intracranial finding. Previous right pterional craniotomy. Previous suboccipital craniectomy with chronic pseudomeningocele. Pronounced atrophy/ postop encephalomalacia of the vermis. Pronounced atrophy/ postoperative encephalomalacia of the right temporal lobe. Generalized brain atrophy elsewhere. Shunt tube in place with chronic ventriculomegaly.  Limited detail MR angiography because of motion shows patency of the large and medium size vessels.   EEG (1/7) -  This EEG is consistent with a nonspecific cerebral dysfunction wide spread in the right hemisphere. There was no seizure or seizure  predisposition recorded on this study. Please note that a normal EEG does not preclude the possibility of epilepsy.    Lamotrigine level (1/7) - 7.8    Discharge Instructions: Continue taking Lamictal, but at a higher dose of 175 mg twice a day.  Each dose will include one 100-mg tablet and three 25-mg tablets.  Follow up with Neurology (Dr. Lars Masson) by the end of January.   Please return to the ED if you do have another seizure.   SignedAnnye Asa, Medical Student 08/18/2017, 8:46 AM   Pager: [phone]

## 2017-08-18 NOTE — Telephone Encounter (Signed)
Patient's daughter called and needs to see if he can have a sooner appointment due to being hospitalized for a Seizure. Please Call. Thanks

## 2017-08-18 NOTE — Progress Notes (Signed)
  Date: 08/18/2017  Patient name: Nicholas Hodges  Medical record number: [id number]  Date of birth: 03-03-1941   I have seen and evaluated this patient and I have discussed the plan of care with the house staff. Please see their note for complete details. 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 Presented from home after prolonged seizure with associated Todd's paralysis. Now with no further seizures since admission. Appreciate neurology assistance. Increased lamotrigine to 175 mg.  On exam today, he is back to baseline, able to move all extremities. He was alert and interactive and eating lunch during our exam. He is safe for discharge home, but should follow-up closely with his neurologist to discuss any further AED adjustment.  Oda Kilts, MD 08/18/2017, 6:32 PM

## 2017-08-18 NOTE — Progress Notes (Signed)
Spoke to [facility] patients RN on 3W. Patient does not have any swelling, redness to site. No concerns.

## 2017-08-18 NOTE — Plan of Care (Signed)
  Education: Knowledge of General Education information will improve 08/18/2017 0756 - Progressing by Anson Fret, RN Note POC reviewed with pt./daughter; daughter is a caregiver for pt. and understands POC for pt.

## 2017-08-18 NOTE — Progress Notes (Signed)
Pt discharged to home with daughter. D/c instructions reviewed with daughter and patient, both have verbalized understanding. Patient showing no signs of distress and no c/o pain. All belongings from bedside are with daughter at discharge.

## 2017-08-18 NOTE — Discharge Instructions (Signed)
Continue taking Lamictal at a higher dose of 175 mg twice a day.  Each dose will include one 100-mg tablet and three 25-mg tablets.  Follow up with Neurology (Dr. Lars Masson) by the end of January.

## 2017-08-25 ENCOUNTER — Ambulatory Visit (INDEPENDENT_AMBULATORY_CARE_PROVIDER_SITE_OTHER): Payer: Medicare Other | Admitting: Neurology

## 2017-08-25 ENCOUNTER — Encounter: Payer: Self-pay | Admitting: Neurology

## 2017-08-25 VITALS — BP 100/58 | HR 63 | Ht 63.5 in | Wt 107.0 lb

## 2017-08-25 DIAGNOSIS — G40211 Localization-related (focal) (partial) symptomatic epilepsy and epileptic syndromes with complex partial seizures, intractable, with status epilepticus: Secondary | ICD-10-CM | POA: Diagnosis not present

## 2017-08-25 DIAGNOSIS — Z87898 Personal history of other specified conditions: Secondary | ICD-10-CM

## 2017-08-25 NOTE — Patient Instructions (Signed)
1. Continue Lamictal 200mg  twice a day 2. Follow-up as scheduled in May, call for any changes  Seizure Precautions: 1. If medication has been prescribed for you to prevent seizures, take it exactly as directed.  Do not stop taking the medicine without talking to your doctor first, even if you have not had a seizure in a long time.   2. Avoid activities in which a seizure would cause danger to yourself or to others.  Don't operate dangerous machinery, swim alone, or climb in high or dangerous places, such as on ladders, roofs, or girders.  Do not drive unless your doctor says you may.  3. If you have any warning that you may have a seizure, lay down in a safe place where you can't hurt yourself.    4.  No driving for 6 months from last seizure, as per [facility].   Please refer to the following link on the [place] website for more information: http://www.epilepsyfoundation.org/answerplace/Social/driving/drivingu.cfm   5.  Maintain good sleep hygiene. Avoid alcohol.  6.  Contact your doctor if you have any problems that may be related to the medicine you are taking.  7.  Call 911 and bring the patient back to the ED if:        A.  The seizure lasts longer than 5 minutes.       B.  The patient doesn't awaken shortly after the seizure  C.  The patient has new problems such as difficulty seeing, speaking or moving  D.  The patient was injured during the seizure  E.  The patient has a temperature over 102 F (39C)  F.  The patient vomited and now is having trouble breathing

## 2017-08-25 NOTE — Progress Notes (Signed)
NEUROLOGY FOLLOW UP OFFICE NOTE  Nicholas Hodges [id number] [birth year]  HISTORY OF PRESENT ILLNESS: I had the pleasure of seeing Nicholas Hodges in follow-up in the neurology clinic on 08/25/2017.  The patient was last seen 6 weeks ago for seizures. He is accompanied by his daughter and son-in-law who help supplement the history today. On his last visit in November 2018, family reported he was doing well on Lamotrigine 150mg  BID. He was back to his baseline and interactive in the office. He was admitted to [facility] last 08/16/17 after a prolonged seizure when his wife was awoken by repeated swallowing followed by a 20-minute GTC followed by right gaze deviation and left-sided weakness. His EEG showed right-sided slowing and right temporal sharp waves. MRI brain no changes from prior MRI with right temporal encephalomalacia, shunt in place with chronic ventriculomegaly. He was close to baseline the next day. Lamictal level was 7.8. Family was instructed to increase Lamotrigine to 200mg  BID. Aside from the prolonged seizure in January, he had one small seizure in November that lasted 5 minutes. They report that he was congested while in the hospital. He is interactive in the office today, family feels he is not quite at baseline but getting close to it. His balance is not as good, family helps him with transfers and notes he is heavier to walk with. He can't hear as good on his right ear. He denies any headaches, dizziness, focal numbness/tingling/weakness. No falls.   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 He is total care at baseline, family helps him with dressing and bathing, and started noticing that he would be leaning forward and was more wobbly recently. His daughter  bought him new shoes, and when she tried to put it on last 8/26, noticed that he was not reacting when his feet were touched, he would usually be very ticklish. His son-in-law put him in the tub for a bath, and he reported that he could not feel patchy areas on his left leg. Family also noticed his speech was slurred and he was more disoriented (could not remember her name). He was brought to the ER, and daughter reports by that time, he was back to baseline, not wanting staff to touch his feet. Family stays with him 24/7 and had not witnessed any seizure activity. In the ER, he was noted to have a supratherapeutic Dilantin level of 32. Records in the ER indicate he was on 300mg  TID, but his wife and daughter today confirm repeatedly that he has been on 100mg  TID Dilantin for many years with no change in dose, no new medications added. His wife gives his medications. CBC, BMP were unremarkable except for mildly low sodium of 132. Urinalysis negative. I personally reviewed head CT without contrast which did not show any acute changes, there was severe atrophy with lateral ventriculomegaly, right frontal approach ventriculostomy catheter with tip in the temporal horn of the right lateral ventricle. 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 Family was instructed to hold Dilantin until Neurology follow-up.  His family reports he has been doing well since hospital discharge, with no further slurred speech. There is some delay in answering questions, but he appears to be  able to answer appropriately when asked about dizziness ("some, not all the time"). His vision is "sometimes blurry, sometimes clear," and states it is blurry today. He denies any neck/back pain, no paresthesias. His wife reports that he continues to have seizures every 2 months or so, he would start staring, get stiff with twitching, with head turn to the left. Seizures last 1-2  minutes, he is drowsy after. He wears adult diapers but family reports he has incontinence with the seizures. This year he has had around 4 seizures. His wife denies any other prior AEDs. They started noticing memory changes for the past several years, he would forget little things, or put shoes on the wrong foot. Family now puts on his clothes and bathes him. He usually repeats himself. They report sleep and appetite are good. His older sister had dementia.   Update 05/14/2017: Over the past 1.5 weeks, he had a significant change in mental status. They saw his PCP yesterday who felt that he may be overmedicated due to Dilantin and Lamotrigine combination. Over the past 1.5 weeks, his family reports that he just wants to lay in bed and does not want to do anything anymore. He used to be able to feed himself, but now his wife feeds him. He has to be carried to the commode. They have noticed a decrease in his urine output, previously they had to change his sheets a lot, but now the adult diapers are less wet. They have not noticed any change in urine color or smell. His BP has been running low. No fever per family. He also states he is dizzy when family tries to stand or sit him up. They have not witnessed any of his typical seizures with body stiffening. He has been hallucinating, speaking very loudly looking at the ceiling or asking what is on the floor. He sees a little boy in a white hat or that someone bit his head. He told his wife he was dying. He has not been complaining of any headaches, no nausea/vomiting. When he eats, appetite is good but he has more difficulties swallowing. He is noted to have some right leg jerking in the office, his daughter reports he has always done that.   Update 07/05/2017: Family was given instructions to start Lamotrigine and start tapering down Dilantin. His daughter had confirmed during his visit on 05/14/17 that he was taking Lamictal 2 tabs twice a day and Dilantin 1 cap  daily. On that visit, family reported a significant change in mental status, he was unable to feed himself and needed more assistance with transfers. Family denied any seizures, but reported hallucinations. He had an EEG done in the office which did not show any electrographic seizure, there was moderate diffuse background slowing and frequent right mid-temporal epileptiform discharges. He was sent to the ER for evaluation, head CT without contrast did not show any acute changes, there were post-surgical changes from the occipital and right temporal craniotomies with areas of resection and encephalomalacia at the anterior right temporal lobe and at the posterior fossa. His Dilantin level was supratherapeutic at 46.2. I spoke to his daughter again and she then reported that they were giving the medications with instructions in reverse, Dilantin 2 caps BID and Lamictal 1 tab daily, thus causing the significant increase in Dilantin level. Dilantin was held in the ER, I instructed her to continue on discontinuing the Dilantin and start uptitrating the Lamictal. He is now on Lamictal 100mg  BID without side effects.  He is much improved today, interactive, able to follow commands. His family reports that he is feeding himself and has gained weight. Since his last visit, he has had one witnessed convulsion with head deviation to the left lasting 5 minutes. It took him 30-45 minutes to recover. He occasional gets dizzy with transfers, no nausea/vomiting.   I personally reviewed head CT without contrast done 04/04/17, no acute changes seen. There was a right frontal approach ventriculostomy catheter with tip in the temporal horn of the right lateral ventricle. There was severe atrophy with lateral ventriculomegaly, cerebellar encephalomalacia and dilated extra-axial space of the posterior fossa.   PAST MEDICAL HISTORY: Past Medical History:  Diagnosis Date  . Arthritis    "knees" (08/17/2017)  . Brain tumor ([place])     . Epilepsy ([place])   . GERD (gastroesophageal reflux disease)   . Headache    "w/seizures and probably a couple monthly without seizures" (08/17/2017)  . Hyperlipidemia   . Hypertension   . 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 Mitral valve disorder    "it leaks" (08/17/2017)  . Myocarditis ([place])   . S/P VP shunt   . Subacute bacterial endocarditis     MEDICATIONS:  Outpatient Encounter Medications as of 08/25/2017  Medication Sig  . aspirin EC 81 MG tablet Take 81 mg by mouth daily.  . Calcium Lactate 648 MG TABS Take 648 mg by mouth at bedtime.   Marland Kitchen ibuprofen (ADVIL,MOTRIN) 800 MG tablet Take 800 mg by mouth every 8 (eight) hours as needed (pain).   . Lamotrigine 100mg  Take 1 tablet (100 mg total) by mouth 2 (two) times daily. (Taking 2 tablets twice a day)  .    Marland Kitchen Melatonin 10 MG TABS Take 10 mg by mouth at bedtime.  Marland Kitchen omeprazole (PRILOSEC) 20 MG capsule Take 20 mg by mouth daily.   . Riboflavin (B-2) 100 MG TABS Take 100 mg by mouth daily.    No facility-administered encounter medications on file as of 08/25/2017.     ALLERGIES: No Known Allergies  FAMILY HISTORY: Family History  Problem Relation Age of Onset  . Aneurysm Mother   . Dementia Sister   . Stomach cancer Sister   . Colon cancer Sister     SOCIAL HISTORY: Social History   Socioeconomic History  . Marital status: Married    Spouse name: Not on file  . Number of children: Not on file  . Years of education: Not on file  . Highest education level: Not on file  Social Needs  . Financial resource strain: Not on file  . Food insecurity - worry: Not on file  . Food insecurity - inability: Not on file  . Transportation needs - medical: Not on file  . Transportation needs - non-medical: Not on file  Occupational History  . Not on file  Tobacco Use  . Smoking status: Never Smoker  . Smokeless tobacco: Never Used  Substance and Sexual Activity  . Alcohol use: No  . Drug use: No  . Sexual activity: Not on file  Other  Topics Concern  . Not on file  Social History Narrative   Pt and wife live with daughter and her family (spouse and 2 adult children) in 2 story home   Highest level of education; 8th grade   Retired Administrator, sports for [employer]   Has 2 children    REVIEW OF SYSTEMS:  Constitutional: No fevers, chills, or sweats, no generalized fatigue, change in appetite Eyes: No  visual changes, double vision, eye pain Ear, nose and throat: No hearing loss, ear pain, nasal congestion, sore throat Cardiovascular: No chest pain, palpitations Respiratory:  No shortness of breath at rest or with exertion, wheezes GastrointestinaI: No nausea, vomiting, diarrhea, abdominal pain, fecal incontinence Genitourinary:  No dysuria, urinary retention or frequency Musculoskeletal:  No neck pain, back pain Integumentary: No rash, pruritus, skin lesions Neurological: as above Psychiatric: No depression, insomnia, anxiety Endocrine: No palpitations, fatigue, diaphoresis, mood swings, change in appetite, change in weight, increased thirst Hematologic/Lymphatic:  No anemia, purpura, petechiae. Allergic/Immunologic: no itchy/runny eyes, nasal congestion, recent allergic reactions, rashes  PHYSICAL EXAM: Vitals:   08/25/17 1558  BP: (!) 100/58  Pulse: 63  SpO2: 92%   General: No acute distress, sitting on wheelchair, mental status slightly reduced compared to last visit, but interactive, smiling, waving Head: Normocephalic/atraumatic Neck: supple, no paraspinal tenderness, full range of motion Heart:  Regular rate and rhythm Lungs:  Clear to auscultation bilaterally Back: No paraspinal tenderness Skin/Extremities: No rash, no edema Neurological Exam: alert and oriented to person, place. He is able to follow commands. Able to name, repeat. He read the sign posted on the door. Cranial nerves: Pupils equal, round, reactive to light.  Extraocular movements intact with no nystagmus. Visual fields full. No facial  asymmetry, edentulous. Tongue, uvula, palate midline.  Motor: Bulk and tone normal, no cogwheeling, moves all extremities symmetrically. Sensation intact to light touch. No incoordination on finger to nose testing. Gait not tested.   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 His wife continued to report seizures every few months while on the Dilantin, we agreed to start Lamotrigine with plans to taper off Dilantin. He was also having supratherapeutic levels of Dilantin causing altered mental status. He is now on Lamotrigine monotherapy, and had a hospital admission last week for prolonged seizure. Lamotrigine dose increased to 200mg  BID. He denies any side effects, he is close to baseline. Continue current dose of Lamotrigine. He will follow-up as scheduled in 4 months and knows to call for any changes.   Thank you for allowing me to participate in his care.  Please do not hesitate to call for any questions or concerns.  The duration of this appointment visit was 25 minutes of face-to-face time with the patient.  Greater than 50% of this time was spent in counseling, explanation of diagnosis, planning of further management, and coordination of care.   Ellouise Newer, M.D.   CC: Threasa Alpha, FNP

## 2017-08-30 ENCOUNTER — Other Ambulatory Visit: Payer: Self-pay | Admitting: Neurology

## 2017-09-01 ENCOUNTER — Encounter: Payer: Self-pay | Admitting: Neurology

## 2017-10-14 ENCOUNTER — Emergency Department (HOSPITAL_COMMUNITY): Payer: Medicare Other

## 2017-10-14 ENCOUNTER — Encounter (HOSPITAL_COMMUNITY): Payer: Self-pay | Admitting: Emergency Medicine

## 2017-10-14 ENCOUNTER — Other Ambulatory Visit: Payer: Self-pay

## 2017-10-14 ENCOUNTER — Emergency Department (HOSPITAL_COMMUNITY)
Admission: EM | Admit: 2017-10-14 | Discharge: 2017-10-14 | Disposition: A | Discharge status: Home / Self Care | Payer: Medicare Other | Attending: Physician Assistant | Admitting: Physician Assistant

## 2017-10-14 DIAGNOSIS — Z7982 Long term (current) use of aspirin: Secondary | ICD-10-CM | POA: Diagnosis not present

## 2017-10-14 DIAGNOSIS — D649 Anemia, unspecified: Secondary | ICD-10-CM | POA: Diagnosis not present

## 2017-10-14 DIAGNOSIS — Z79899 Other long term (current) drug therapy: Secondary | ICD-10-CM | POA: Insufficient documentation

## 2017-10-14 DIAGNOSIS — I10 Essential (primary) hypertension: Secondary | ICD-10-CM | POA: Diagnosis not present

## 2017-10-14 DIAGNOSIS — Z8659 Personal history of other mental and behavioral disorders: Secondary | ICD-10-CM | POA: Diagnosis not present

## 2017-10-14 DIAGNOSIS — G40909 Epilepsy, unspecified, not intractable, without status epilepticus: Secondary | ICD-10-CM | POA: Diagnosis not present

## 2017-10-14 DIAGNOSIS — R4182 Altered mental status, unspecified: Secondary | ICD-10-CM | POA: Diagnosis present

## 2017-10-14 LAB — URINALYSIS, ROUTINE W REFLEX MICROSCOPIC
Bilirubin Urine: NEGATIVE
Glucose, UA: NEGATIVE mg/dL
Hgb urine dipstick: NEGATIVE
Ketones, ur: NEGATIVE mg/dL
Leukocytes, UA: NEGATIVE
Nitrite: NEGATIVE
Protein, ur: NEGATIVE mg/dL
Specific Gravity, Urine: 1.021 (ref 1.005–1.030)
pH: 6 (ref 5.0–8.0)

## 2017-10-14 LAB — POC OCCULT BLOOD, ED: Fecal Occult Bld: NEGATIVE

## 2017-10-14 LAB — ETHANOL: Alcohol, Ethyl (B): <10 mg/dL (ref <10)

## 2017-10-14 LAB — CBC
HCT: 29.4 % — ABNORMAL LOW (ref 39.0–52.0)
Hemoglobin: 9.4 g/dL — ABNORMAL LOW (ref 13.0–17.0)
MCH: 31.2 pg (ref 26.0–34.0)
MCHC: 32 g/dL (ref 30.0–36.0)
MCV: 97.7 fL (ref 78.0–100.0)
Platelets: UNDETERMINED 10*3/uL (ref 150–400)
RBC: 3.01 MIL/uL — ABNORMAL LOW (ref 4.22–5.81)
RDW: 14.6 % (ref 11.5–15.5)
WBC: 3.5 10*3/uL — ABNORMAL LOW (ref 4.0–10.5)

## 2017-10-14 LAB — COMPREHENSIVE METABOLIC PANEL
ALT: 17 U/L (ref 17–63)
AST: 40 U/L (ref 15–41)
Albumin: 3.2 g/dL — ABNORMAL LOW (ref 3.5–5.0)
Alkaline Phosphatase: 86 U/L (ref 38–126)
Anion gap: 10 (ref 5–15)
BUN: 13 mg/dL (ref 6–20)
CO2: 20 mmol/L — ABNORMAL LOW (ref 22–32)
Calcium: 8 mg/dL — ABNORMAL LOW (ref 8.9–10.3)
Chloride: 110 mmol/L (ref 101–111)
Creatinine, Ser: 0.8 mg/dL (ref 0.61–1.24)
GFR calc Af Amer: >60 mL/min (ref >60)
GFR calc non Af Amer: >60 mL/min (ref >60)
Glucose, Bld: 79 mg/dL (ref 65–99)
Potassium: 5.1 mmol/L (ref 3.5–5.1)
Sodium: 140 mmol/L (ref 135–145)
Total Bilirubin: 1.3 mg/dL — ABNORMAL HIGH (ref 0.3–1.2)
Total Protein: 5.3 g/dL — ABNORMAL LOW (ref 6.5–8.1)

## 2017-10-14 LAB — DIFFERENTIAL
Basophils Absolute: 0 10*3/uL (ref 0.0–0.1)
Basophils Relative: 1 %
Eosinophils Absolute: 0.1 10*3/uL (ref 0.0–0.7)
Eosinophils Relative: 3 %
Lymphocytes Relative: 21 %
Lymphs Abs: 0.7 10*3/uL (ref 0.7–4.0)
Monocytes Absolute: 0.2 10*3/uL (ref 0.1–1.0)
Monocytes Relative: 6 %
Neutro Abs: 2.4 10*3/uL (ref 1.7–7.7)
Neutrophils Relative %: 69 %

## 2017-10-14 LAB — IRON AND TIBC
Iron: 40 ug/dL — ABNORMAL LOW (ref 45–182)
Saturation Ratios: 13 % — ABNORMAL LOW (ref 17.9–39.5)
TIBC: 304 ug/dL (ref 250–450)
UIBC: 264 ug/dL

## 2017-10-14 LAB — FOLATE: Folate: 22.1 ng/mL (ref >5.9)

## 2017-10-14 LAB — PROTIME-INR
INR: 1.46
Prothrombin Time: 17.6 seconds — ABNORMAL HIGH (ref 11.4–15.2)

## 2017-10-14 LAB — RETICULOCYTES
RBC.: 3.04 MIL/uL — ABNORMAL LOW (ref 4.22–5.81)
RETIC COUNT ABSOLUTE: 42.6 10*3/uL (ref 19.0–186.0)
Retic Ct Pct: 1.4 % (ref 0.4–3.1)

## 2017-10-14 LAB — FERRITIN: Ferritin: 14 ng/mL — ABNORMAL LOW (ref 24–336)

## 2017-10-14 LAB — APTT: aPTT: 39 seconds — ABNORMAL HIGH (ref 24–36)

## 2017-10-14 LAB — VITAMIN B12: Vitamin B-12: 403 pg/mL (ref 180–914)

## 2017-10-14 LAB — I-STAT TROPONIN, ED: Troponin i, poc: 0 ng/mL (ref 0.00–0.08)

## 2017-10-14 NOTE — ED Notes (Signed)
Alex Law-PA notified of elevated K of 7.1 on Chem-8

## 2017-10-14 NOTE — ED Notes (Signed)
Patient transported to X-ray 

## 2017-10-14 NOTE — ED Triage Notes (Signed)
Pt from home via EMS for initial call of possible stroke. 1000 LKW nonverbal, grunting but not saying words per family. EMS arrived 1320, EMS states pt is able to talk fluently. Family states 1 month ago pt had TIA/seizure. Stroke screening negative. 154/90, 70, 18 resp, 92% room air, placed on nasal cannula at 4 L per EMS. NSR on monitor.

## 2017-10-14 NOTE — ED Provider Notes (Signed)
Physical Exam  BP 131/67   Pulse 68   Temp 98.5 F (36.9 C) (Oral)   Resp 19   Wt 48.5 kg (107 lb)   SpO2 98%   BMI 18.66 kg/m   Assumed care from Armstead Peaks, PA-C at 5:03 PM. Briefly, the patient is a 77 y.o. 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 here with transient episode of aphasia without other neurologic features today.   Four hour episode of being nonverbal today. Patient's wife present, as well as son and [facility] RN noted as well.By time patient presented to ED, patient was neuro intact. Patient noted to be neuro intact by PA Law's evaluation  Does have progressive drop in hgb (see below for values). Hemocult negative. Denies SOB, CP over this entire incident. Hemocult negative.  Hemoglobin  Date Value Ref Range Status  10/14/2017 14.3 13.0 - 17.0 g/dL Final  10/14/2017 9.4 (L) 13.0 - 17.0 g/dL Final  08/17/2017 11.8 (L) 13.0 - 17.0 g/dL Final  08/16/2017 12.4 (L) 13.0 - 17.0 g/dL Final    Family and pt prefer not to be admitted. Could have workup tomorrow with PCP for drop in hemoglobin.   Labs Reviewed  PROTIME-INR - Abnormal; Notable for the following components:      Result Value   Prothrombin Time 17.6 (*)    All other components within normal limits  APTT - Abnormal; Notable for the following components:   aPTT 39 (*)    All other components within normal limits  CBC - Abnormal; Notable for the following components:   WBC 3.5 (*)    RBC 3.01 (*)    Hemoglobin 9.4 (*)    HCT 29.4 (*)    All other components within normal limits  COMPREHENSIVE METABOLIC PANEL - Abnormal; Notable for the following components:   CO2 20 (*)    Calcium 8.0 (*)    Total Protein 5.3 (*)    Albumin 3.2 (*)    Total Bilirubin 1.3 (*)    All other  components within normal limits  RETICULOCYTES - Abnormal; Notable for the following components:   RBC. 3.04 (*)    All other components within normal limits  I-STAT CHEM 8, ED - Abnormal; Notable for the following components:   Potassium 7.1 (*)    Calcium, Ion 0.96 (*)    All other components within normal limits  ETHANOL  DIFFERENTIAL  URINALYSIS, ROUTINE W REFLEX MICROSCOPIC  VITAMIN B12  FOLATE  IRON AND TIBC  FERRITIN  I-STAT TROPONIN, ED  POC OCCULT BLOOD, ED     Hemoglobin  Date Value Ref Range Status  10/14/2017 14.3 13.0 - 17.0 g/dL Final  10/14/2017 9.4 (L) 13.0 - 17.0 g/dL Final  08/17/2017 11.8 (L) 13.0 - 17.0 g/dL Final  08/16/2017 12.4 (L) 13.0 - 17.0 g/dL Final    Physical Exam  Constitutional: He appears well-developed and well-nourished. No distress.  Sitting comfortably in bed.  HENT:  Head: Normocephalic and atraumatic.  Eyes: Conjunctivae are normal. Right eye exhibits no discharge. Left eye exhibits no discharge.  EOMs normal to gross examination.  Neck: Normal range of motion.  Cardiovascular: Normal rate and regular rhythm.  Intact, 2+ radial pulse.  Pulmonary/Chest: Effort normal and breath sounds normal. No respiratory distress. He has no wheezes. He has no rales.  Normal  respiratory effort. Patient converses comfortably. No audible wheeze or stridor.  Abdominal: He exhibits no distension.  Musculoskeletal: Normal range of motion.  Neurological: He is alert.  Cranial nerves intact to gross observation. Patient moves extremities without difficulty. Speech normal without evidence of a aphasia.  Skin: Skin is warm and dry. He is not diaphoretic.  Nodular skin growths noted diffusely on face, trunk, arms.   Psychiatric: He has a normal mood and affect. His behavior is normal. Judgment and thought content normal.  Nursing note and vitals reviewed.   ED Course/Procedures     Procedures  MDM    Plan to send urinalysis and DC to  home.  Urinalysis shows no signs of infection.  On discussion with PA law, and chart review, it was found that patient has not had entire TIA workup including carotid Dopplers.  This was noted in her note, and submitted to PCP.  Patient and family still wished not to be admitted at this time if possible, and per shared decision making with PA law and family, they were amenable to close follow-up with PCP tomorrow morning.  Patient and his daughter given return precautions by me for any further episodes of mental status, aphasia, unilateral weakness, facial drooping.  Patient and his family understanding and agree with the plan of care.      Albesa Seen, PA-C 10/15/17 0205    Macarthur Critchley, MD 10/20/17 [phone]

## 2017-10-14 NOTE — Discharge Instructions (Signed)
Please return to the emergency department if Nicholas Hodges has any more episodes where he is unable to speak, appears to be neglecting one side of his body or one side appears weaker, or if he exhibits facial droop.  Please follow-up with your primary care provider tomorrow morning.  I included all the lab work as well as imaging studies reports today.

## 2017-10-14 NOTE — ED Provider Notes (Addendum)
[facility] EMERGENCY DEPARTMENT Provider Note   CSN: [id number] Arrival date & time: 10/14/17  1417     History   Chief Complaint Chief Complaint  Patient presents with  . Altered Mental Status    HPI Nicholas OEHLERT is a 77 y.o. male with history of dementia, epilepsy, hypertension, hyperlipidemia, recent TIA on 08/16/2017 with subsequent hospital admission for stroke workup, who presents following a 4-hour episode of altered mental status.  Patient is back to baseline now.  Around 10 AM this morning, patient began having shortness of breath and following was not able to speak any words.  He was nonverbal for about 4 hours.  He is back to baseline, per family.  He denies any symptoms or pain at this time.  He denies any shortness of breath.  He has been feeling well prior to today.  He denies any weakness, numbness, or vision changes.  Patient's last TIA exhibited with left sided numbness and weakness.  Patient had associated seizure at the time.  Patient is currently taking Lamictal for his seizures.  HPI  Past Medical History:  Diagnosis Date  . Arthritis    "knees" (08/17/2017)  . Brain tumor ([place])   . Epilepsy ([place])   . GERD (gastroesophageal reflux disease)   . Headache    "w/seizures and probably a couple monthly without seizures" (08/17/2017)  . Hyperlipidemia   . Hypertension   . 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 Mitral valve disorder    "it leaks" (08/17/2017)  . Myocarditis ([place])   . S/P VP shunt   . Subacute bacterial endocarditis     Patient Active Problem List   Diagnosis Date Noted  . 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 Localization-related (focal) (partial) symptomatic epilepsy and epileptic syndromes with complex partial seizures, not intractable, without status epilepticus ([place]) 04/13/2017  . History of brain tumor 04/13/2017    Past Surgical History:  Procedure Laterality Date  . BRAIN TUMOR EXCISION  1956  . BUNIONECTOMY Right   . CARDIAC  CATHETERIZATION    . 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 VENTRICULOPERITONEAL SHUNT  1956       Home Medications    Prior to Admission medications   Medication Sig Start Date End Date Taking? Authorizing Provider  aspirin EC 81 MG tablet Take 81 mg by mouth daily.    [provider]  Calcium Lactate 648 MG TABS Take 648 mg by mouth at bedtime.     [provider]  ibuprofen (ADVIL,MOTRIN) 800 MG tablet Take 800 mg by mouth every 8 (eight) hours as needed (pain).     [provider]  lamoTRIgine (LAMICTAL) 100 MG tablet Take 1 tablet (100 mg total) by mouth 2 (two) times daily. 08/18/17 09/17/17  Maryellen Pile, MD  lamoTRIgine (LAMICTAL) 25 MG tablet Take 3 tablets (75 mg total) by mouth 2 (two) times daily. 08/18/17 09/17/17  Maryellen Pile, MD  Melatonin 10 MG TABS Take 10 mg by mouth at bedtime.    [provider]  omeprazole (PRILOSEC) 20 MG capsule Take 20 mg by mouth daily.  04/05/17   [provider]  Riboflavin (B-2) 100 MG TABS Take 100 mg by mouth daily.     [provider]    Family History Family History  Problem Relation Age of Onset  . Aneurysm Mother   .  Dementia Sister   . Stomach cancer Sister   . Colon cancer Sister     Social History Social History   Tobacco Use  . Smoking status: Never Smoker  . Smokeless tobacco: Never Used  Substance Use Topics  . Alcohol use: No  . Drug use: No     Allergies   Patient has no known allergies.   Review of Systems Review of Systems  Constitutional: Negative for chills and fever.  HENT: Negative for facial swelling and sore throat.   Respiratory: Positive for shortness of breath (resolved).   Cardiovascular: Negative for chest pain.  Gastrointestinal: Negative for abdominal pain, nausea and vomiting.  Genitourinary: Negative for dysuria.  Musculoskeletal: Negative for back pain.  Skin:  Negative for rash and wound.  Neurological: Positive for speech difficulty. Negative for headaches.  Psychiatric/Behavioral: The patient is not nervous/anxious.      Physical Exam Updated Vital Signs BP 121/65   Pulse 70   Temp 98.5 F (36.9 C) (Oral)   Resp 18   Wt 48.5 kg (107 lb)   SpO2 99%   BMI 18.66 kg/m   Physical Exam  Constitutional: He appears well-developed and well-nourished. No distress.  HENT:  Head: Normocephalic and atraumatic.  Mouth/Throat: Oropharynx is clear and moist. No oropharyngeal exudate.  Eyes: Conjunctivae are normal. Pupils are equal, round, and reactive to light. Right eye exhibits no discharge. Left eye exhibits no discharge. No scleral icterus.  Neck: Normal range of motion. Neck supple. No thyromegaly present.  Cardiovascular: Normal rate, regular rhythm, normal heart sounds and intact distal pulses. Exam reveals no gallop and no friction rub.  No murmur heard. Pulmonary/Chest: Effort normal and breath sounds normal. No stridor. No respiratory distress. He has no wheezes. He has no rales.  Abdominal: Soft. Bowel sounds are normal. He exhibits no distension. There is no tenderness. There is no rebound and no guarding.  Musculoskeletal: He exhibits no edema.  Lymphadenopathy:    He has no cervical adenopathy.  Neurological: He is alert. Coordination normal.  CN 3-12 intact; normal sensation throughout; 5/5 strength in all 4 extremities; equal bilateral grip strength; no ataxia on finger to nose; normal heel to shin test Oriented to self and place, which is baseline per family  Skin: Skin is warm and dry. No rash noted. He is not diaphoretic. No pallor.  Psychiatric: He has a normal mood and affect.  Nursing note and vitals reviewed.    ED Treatments / Results  Labs (all labs ordered are listed, but only abnormal results are displayed) Labs Reviewed  PROTIME-INR - Abnormal; Notable for the following components:      Result Value    Prothrombin Time 17.6 (*)    All other components within normal limits  APTT - Abnormal; Notable for the following components:   aPTT 39 (*)    All other components within normal limits  CBC - Abnormal; Notable for the following components:   WBC 3.5 (*)    RBC 3.01 (*)    Hemoglobin 9.4 (*)    HCT 29.4 (*)    All other components within normal limits  COMPREHENSIVE METABOLIC PANEL - Abnormal; Notable for the following components:   CO2 20 (*)    Calcium 8.0 (*)    Total Protein 5.3 (*)    Albumin 3.2 (*)    Total Bilirubin 1.3 (*)    All other components within normal limits  RETICULOCYTES - Abnormal; Notable for the following components:   RBC. 3.04 (*)  All other components within normal limits  I-STAT CHEM 8, ED - Abnormal; Notable for the following components:   Potassium 7.1 (*)    Calcium, Ion 0.96 (*)    All other components within normal limits  ETHANOL  DIFFERENTIAL  URINALYSIS, ROUTINE W REFLEX MICROSCOPIC  VITAMIN B12  FOLATE  IRON AND TIBC  FERRITIN  I-STAT TROPONIN, ED  POC OCCULT BLOOD, ED    EKG  EKG Interpretation  Date/Time:  Thursday October 14 2017 14:17:54 EST Ventricular Rate:  81 PR Interval:    QRS Duration: 91 QT Interval:  379 QTC Calculation: 440 R Axis:   76 Text Interpretation:  Sinus rhythm No significant change since last tracing Confirmed by Lajean Saver [phone]) on 10/14/2017 2:21:37 PM Also confirmed by Lajean Saver [phone]), editor Philomena Doheny [phone])  on 10/14/2017 4:11:06 PM       Radiology Dg Chest 2 View  Result Date: 10/14/2017 CLINICAL DATA:  Shortness of breath EXAM: CHEST - 2 VIEW COMPARISON:  08/16/2017 FINDINGS: Unchanged chronic elevation of the right hemidiaphragm. No focal consolidation. No pneumothorax or pleural effusion. IMPRESSION: Unchanged elevation of the right hemidiaphragm without acute airspace disease. Electronically Signed   By: Ulyses Jarred M.D.   On: 10/14/2017 15:31   Ct Head Wo Contrast  Result  Date: 10/14/2017 CLINICAL DATA:  TIA, altered consciousness.  Shunt. EXAM: CT HEAD WITHOUT CONTRAST TECHNIQUE: Contiguous axial images were obtained from the base of the skull through the vertex without intravenous contrast. COMPARISON:  08/16/2017 FINDINGS: Brain: Advanced atrophy. Marked ventricular enlargement stable. Ventricular shunt on the right extends into the right temporal horn unchanged in position. Postop craniotomy on the right. 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 Encephalomalacia in the cerebellum bilaterally with diffuse cerebellar atrophy. Negative for acute infarct or hemorrhage.  Negative for mass lesion. Vascular: Negative for hyperdense vessel Skull: Suboccipital craniectomy unchanged. Right temporal craniotomy Sinuses/Orbits: Right cataract removal. No orbital mass. Paranasal sinuses clear. Other: None IMPRESSION: No change from the prior study. Generalized atrophy. Marked ventricular enlargement stable. Right ventricular catheter unchanged in position. Electronically Signed   By: Franchot Gallo M.D.   On: 10/14/2017 15:50    Procedures Procedures (including critical care time)  Medications Ordered in ED Medications - No data to display   Initial Impression / Assessment and Plan / ED Course  I have reviewed the triage vital signs and the nursing notes.  Pertinent labs & imaging results that were available during my care of the patient were reviewed by me and considered in my medical decision making (see chart for details).     Patient with possible seizure versus TIA.  Per patient's family, patient had seizure versus TIA 2 months ago.  Patient's family thought the patient had a stroke workup, and patient did have several head images, however I cannot find an echocardiogram or carotid Dopplers or other parts of the stroke workup in his chart.  Patient is back to baseline at this time.  Patient has had a  progressive decline in his hemoglobin, but has been asymptomatic, per family.  Anemia panel is pending. Using shared decision making, I discussed the option of admitting the patient for workup of these things, however they would prefer to follow-up with PCP, although they are open to either option.  Considering the patient's benign exam and he is back to baseline, I feel it is reasonable to follow-up with PCP with strict return precautions.  Family made an appointment for 9  AM tomorrow with the PCP.  UA is pending at shift change.  Langston Masker, PA-C will follow this up.  Pending it is negative, will plan for discharge home with follow-up to PCP tomorrow.   I have also advised follow-up to patient's neurologist.  Strict return precautions given.  Patient and family understand and agree with plan.  Patient vitals stable throughout ED course.  Final Clinical Impressions(s) / ED Diagnoses   Final diagnoses:  Mental status change resolved  Anemia, unspecified type    ED Discharge Orders    None         Frederica Kuster, PA-C 10/14/17 1738    Lajean Saver, MD 10/15/17 [phone]

## 2017-10-14 NOTE — ED Notes (Signed)
Urinal at beside for urine specimen

## 2017-10-18 LAB — I-STAT CHEM 8, ED
BUN: 18 mg/dL (ref 6–20)
Calcium, Ion: 0.96 mmol/L — ABNORMAL LOW (ref 1.15–1.40)
Chloride: 108 mmol/L (ref 101–111)
Creatinine, Ser: 0.7 mg/dL (ref 0.61–1.24)
Glucose, Bld: 79 mg/dL (ref 65–99)
HCT: 42 % (ref 39.0–52.0)
Hemoglobin: 14.3 g/dL (ref 13.0–17.0)
Potassium: 7.1 mmol/L (ref 3.5–5.1)
Sodium: 137 mmol/L (ref 135–145)
TCO2: 26 mmol/L (ref 22–32)

## 2017-10-20 ENCOUNTER — Inpatient Hospital Stay: Payer: Medicare Other | Attending: Oncology | Admitting: Oncology

## 2017-10-20 ENCOUNTER — Encounter: Payer: Self-pay | Admitting: Oncology

## 2017-10-20 VITALS — BP 112/71 | HR 80 | Temp 98.0°F | Resp 18 | Ht 63.5 in | Wt 111.3 lb

## 2017-10-20 DIAGNOSIS — I10 Essential (primary) hypertension: Secondary | ICD-10-CM | POA: Diagnosis not present

## 2017-10-20 DIAGNOSIS — D649 Anemia, unspecified: Secondary | ICD-10-CM

## 2017-10-20 DIAGNOSIS — D509 Iron deficiency anemia, unspecified: Secondary | ICD-10-CM | POA: Diagnosis present

## 2017-10-20 DIAGNOSIS — Z79899 Other long term (current) drug therapy: Secondary | ICD-10-CM | POA: Insufficient documentation

## 2017-10-20 NOTE — Progress Notes (Signed)
Pt has dementia and he and his wife lives with his daughter and son in [place].  Patient and son in law says they do not see blood in urine or stool. He has a great appetite and eats anything put in front of him

## 2017-10-20 NOTE — Addendum Note (Signed)
Addended by: Earlie Server on: 10/20/2017 05:28 PM   Modules accepted: Orders

## 2017-10-20 NOTE — Addendum Note (Signed)
Addended by: Earlie Server on: 10/20/2017 09:28 PM   Modules accepted: Orders

## 2017-10-20 NOTE — Progress Notes (Addendum)
Hematology/Oncology Consult note [facility] [phone] [phone]   Patient Care Team: Remi Haggard, FNP as PCP - General (Family Medicine)  REFERRING PROVIDER: Remi Haggard, FNP CHIEF COMPLAINTS/PURPOSE OF CONSULTATION:  Evaluation of iron deficiency anemia.   HISTORY OF PRESENTING ILLNESS:  [facility] Hodges is a  77 y.o.  male with PMH listed below who was referred to me for evaluation of iron deficiency anemia.  Patient had lab work up recently during his ER visit for seizure vs TIA symptoms,family reports patient has had stroked work up recently, in ED, was found that Echo and carotid doppler were not done. Of note, patient and family members did not want inpatient work up so her was discharged to follow up with PCP.  CT head 10/14/2017 no acute changes.  In ED,  Lab on 10/14/2017 showed mild leukopenia with wbc 3.5, hemoglobin 9.4,decreased from his baseline of 11.8 which was a month ago. Iron panel showed TIBC 304, ferritin 14, % 13. B12 was 403.  Denies any blood in stool or black tarry stool.  Patient denies having fatigue. Family reports he has good appetite. Never had any colonoscopy. He had dementia. Lives with wife, daughter and son in law.   Review of Systems  Constitutional: Negative for chills, fever and malaise/fatigue.  HENT: Negative for hearing loss and nosebleeds.   Eyes: Negative for double vision and discharge.  Respiratory: Negative for cough, hemoptysis and sputum production.   Cardiovascular: Negative for chest pain and palpitations.  Gastrointestinal: Negative for abdominal pain, blood in stool, heartburn, melena, nausea and vomiting.  Genitourinary: Negative for dysuria.  Musculoskeletal: Negative for myalgias.  Skin: Negative for rash.  Neurological: Positive for seizures. Negative for dizziness, tremors, sensory change and focal weakness.  Endo/Heme/Allergies: Does not bruise/bleed easily.    Psychiatric/Behavioral: Positive for memory loss.    MEDICAL HISTORY:  Past Medical History:  Diagnosis Date  . Anemia   . Arthritis    "knees" (08/17/2017)  . Brain tumor (benign) ([place])   . Dementia   . Epilepsy ([place])   . GERD (gastroesophageal reflux disease)   . Headache    "w/seizures and probably a couple monthly without seizures" (08/17/2017)  . Hyperlipidemia   . Hypertension   . 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 Mitral valve disorder    "it leaks" (08/17/2017)  . Myocarditis ([place])   . S/P VP shunt   . Skull fracture ([place])   . Subacute bacterial endocarditis     SURGICAL HISTORY: Past Surgical History:  Procedure Laterality Date  . BRAIN TUMOR EXCISION  1956  . BUNIONECTOMY Right   . CARDIAC CATHETERIZATION    . 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 VENTRICULOPERITONEAL SHUNT  1956    SOCIAL HISTORY: Social History   Socioeconomic History  . Marital status: Married    Spouse name: Not on file  . Number of children: Not on file  . Years of education: Not on file  . Highest education level: Not on file  Social Needs  . Financial resource strain: Not on file  . Food insecurity - worry: Not on file  . Food insecurity - inability: Not on file  . Transportation needs - medical: Not on file  . Transportation needs - non-medical: Not on file  Occupational History  . Not on file  Tobacco Use  . Smoking  status: Never Smoker  . Smokeless tobacco: Never Used  Substance and Sexual Activity  . Alcohol use: No  . Drug use: No  . Sexual activity: Not on file  Other Topics Concern  . Not on file  Social History Narrative   Pt and wife live with daughter and her family (spouse and 2 adult children) in 2 story home   Highest level of education; 8th grade   Retired Administrator, sports for [employer]   Has 2 children    FAMILY HISTORY: Family History  Problem Relation Age of Onset  . Aneurysm  Mother   . Dementia Sister   . Stomach cancer Sister   . Colon cancer Sister     ALLERGIES:  has No Known Allergies.  MEDICATIONS:  Current Outpatient Medications  Medication Sig Dispense Refill  . aspirin EC 81 MG tablet Take 81 mg by mouth daily.    . Calcium Lactate 648 MG TABS Take 648 mg by mouth at bedtime.     Marland Kitchen ibuprofen (ADVIL,MOTRIN) 800 MG tablet Take 800 mg by mouth every 8 (eight) hours as needed (pain).     . Melatonin 10 MG TABS Take 10 mg by mouth at bedtime.    Marland Kitchen omeprazole (PRILOSEC) 20 MG capsule Take 20 mg by mouth daily.     . Riboflavin (B-2) 100 MG TABS Take 100 mg by mouth daily.     Marland Kitchen lamoTRIgine (LAMICTAL) 100 MG tablet Take 1 tablet (100 mg total) by mouth 2 (two) times daily. (Patient taking differently: Take 200 mg by mouth 2 (two) times daily. ) 60 tablet 0  . lamoTRIgine (LAMICTAL) 25 MG tablet Take 3 tablets (75 mg total) by mouth 2 (two) times daily. 180 tablet 0   No current facility-administered medications for this visit.      PHYSICAL EXAMINATION: ECOG PERFORMANCE STATUS: 2 - Symptomatic, <50% confined to bed Vitals:   10/20/17 1106  BP: 112/71  Pulse: 80  Resp: 18  Temp: 98 F (36.7 C)   Filed Weights   10/20/17 1106  Weight: 111 lb 4.8 oz (50.5 kg)    Physical Exam  Constitutional: He is oriented to person, place, and time and well-developed, well-nourished, and in no distress. No distress.  HENT:  Head: Normocephalic.  Mouth/Throat: No oropharyngeal exudate.  Eyes: EOM are normal. Pupils are equal, round, and reactive to light. No scleral icterus.  Neck: Normal range of motion. Neck supple.  Cardiovascular: Normal rate and regular rhythm.  No murmur heard. Pulmonary/Chest: Effort normal and breath sounds normal. No respiratory distress.  Abdominal: Soft. Bowel sounds are normal. He exhibits no distension. There is no tenderness.  Musculoskeletal: Normal range of motion. He exhibits no edema.  Neurological: He is alert and  oriented to person, place, and time. No cranial nerve deficit.  Skin: Skin is warm and dry.     LABORATORY DATA:  I have reviewed the data as listed Lab Results  Component Value Date   WBC 3.5 (L) 10/14/2017   HGB 14.3 10/14/2017   HCT 42.0 10/14/2017   MCV 97.7 10/14/2017   PLT PLATELET CLUMPS NOTED ON SMEAR, UNABLE TO ESTIMATE 10/14/2017   Recent Labs    08/16/17 1108 08/17/17 0425 10/14/17 1444 10/14/17 1516  NA 137 140 140 137  K 4.5 3.8 5.1 7.1*  CL 111 112* 110 108  CO2 21* 22 20*  --   GLUCOSE 99 86 79 79  BUN 16 9 13 18   CREATININE 0.96 0.78 0.80 0.70  CALCIUM 7.5* 7.9* 8.0*  --   GFRNONAA >60 >60 >60  --   GFRAA >60 >60 >60  --   PROT 5.0* 4.8* 5.3*  --   ALBUMIN 2.5* 2.4* 3.2*  --   AST 25 30 40  --   ALT 8* 11* 17  --   ALKPHOS 88 86 86  --   BILITOT 0.8 0.5 1.3*  --     Urine dipstick negative for hgb   ASSESSMENT & PLAN:  1. Iron deficiency anemia, unspecified iron deficiency anemia type    Plan IV iron with Venofer 200mg  weekly for 4 doses. Allergy reactions/infusion reaction including anaphylactic reaction discussed with patient. Patient and family members voices understanding and willing to proceed. Refer to gastroenterologist for evaluation  B12 level at 400s, borderline, will check MMA at next visit.  All questions were answered. The patient knows to call the clinic with any problems questions or concerns.  Return of visit: 6 weeks Thank you for this kind referral and the opportunity to participate in the care of this patient. A copy of today's note is routed to referring provider    Earlie Server, MD, PhD Hematology Oncology [facility] Pager- [phone] 10/20/2017

## 2017-10-25 ENCOUNTER — Telehealth: Payer: Self-pay | Admitting: *Deleted

## 2017-10-25 ENCOUNTER — Other Ambulatory Visit: Payer: Self-pay

## 2017-10-25 DIAGNOSIS — R319 Hematuria, unspecified: Secondary | ICD-10-CM

## 2017-10-25 NOTE — Telephone Encounter (Signed)
Per Dr Tasia Catchings [facility] C&S

## 2017-10-25 NOTE — Telephone Encounter (Signed)
Daughter called and states that they noticed that patient is having blood in his urine when changing his diaper. She states his urine smells very strong. Asking what needs to be done, he is scheduled for Iron inf Wed. Please advise.

## 2017-10-25 NOTE — Telephone Encounter (Signed)
Daughter will come by today to pickup collection cup then get sample in the morning and bring it in immediately after collected

## 2017-10-26 ENCOUNTER — Inpatient Hospital Stay: Payer: Medicare Other

## 2017-10-26 ENCOUNTER — Other Ambulatory Visit: Payer: Self-pay

## 2017-10-26 ENCOUNTER — Telehealth: Payer: Self-pay | Admitting: Neurology

## 2017-10-26 ENCOUNTER — Other Ambulatory Visit: Payer: Self-pay | Admitting: *Deleted

## 2017-10-26 DIAGNOSIS — D509 Iron deficiency anemia, unspecified: Secondary | ICD-10-CM | POA: Diagnosis not present

## 2017-10-26 DIAGNOSIS — R319 Hematuria, unspecified: Secondary | ICD-10-CM

## 2017-10-26 LAB — URINALYSIS, COMPLETE (UACMP) WITH MICROSCOPIC
BILIRUBIN URINE: NEGATIVE
GLUCOSE, UA: NEGATIVE mg/dL
Ketones, ur: NEGATIVE mg/dL
Nitrite: POSITIVE — AB
PH: 5 (ref 5.0–8.0)
Protein, ur: NEGATIVE mg/dL
SPECIFIC GRAVITY, URINE: 1.018 (ref 1.005–1.030)

## 2017-10-26 MED ORDER — LAMOTRIGINE 100 MG PO TABS: 100.0000 mg | ORAL_TABLET | Freq: Every day | ORAL | 0 refills | Status: DC

## 2017-10-26 NOTE — Telephone Encounter (Signed)
Spoke with pt's daughter relaying message below.  Current Rx is 200-mg tabets - 1 tab BID.  Rx sent to verified preferred pharmacy for 100mg  tablet - instructed Wilburn Cornelia to give half tab with each 200mg  tab.

## 2017-10-26 NOTE — Telephone Encounter (Signed)
Pt's daughter Wilburn Cornelia left a VM message saying pt has had 2 seizures, one last week and one last night, wants to know if pt should see Dr Delice Lesch sooner than scheduled appointment, please advise

## 2017-10-26 NOTE — Telephone Encounter (Signed)
Spoke with pt's daughter.  She states that pt has had 2 seizures, last week and another last night.  She says that pt also had recent labwork done - hemoglobin was 9 so he starts iron infusions tomorrow for 6 weeks.    Pt scheduled for earlier follow up with Dr. Delice Lesch on April 11 @ 9:00am and also placed on high priority wait list.

## 2017-10-26 NOTE — Telephone Encounter (Signed)
I have him as taking the Lamotrigine 100mg  2 tabs BID, is this how he is taking it? If yes, pls have her increase to 2.5 tabs BID until I see him in April. Pls send new Rx if needed, thanks.

## 2017-10-27 ENCOUNTER — Other Ambulatory Visit: Payer: Self-pay | Admitting: Oncology

## 2017-10-27 ENCOUNTER — Telehealth: Payer: Self-pay | Admitting: Neurology

## 2017-10-27 ENCOUNTER — Inpatient Hospital Stay: Payer: Medicare Other

## 2017-10-27 VITALS — BP 129/78 | HR 96 | Temp 99.0°F | Resp 16

## 2017-10-27 DIAGNOSIS — D509 Iron deficiency anemia, unspecified: Secondary | ICD-10-CM | POA: Diagnosis not present

## 2017-10-27 MED ORDER — NITROFURANTOIN MONOHYD MACRO 100 MG PO CAPS: 100.0000 mg | ORAL_CAPSULE | Freq: Two times a day (BID) | ORAL | 0 refills | Status: DC

## 2017-10-27 MED ORDER — SODIUM CHLORIDE 0.9 % IV SOLN
Freq: Once | INTRAVENOUS | Status: AC
  Administered 2017-10-27: 15:00:00 via INTRAVENOUS
  Filled 2017-10-27: qty 1000

## 2017-10-27 MED ORDER — IRON SUCROSE 20 MG/ML IV SOLN
200.0000 mg | Freq: Once | INTRAVENOUS | Status: AC
  Administered 2017-10-27: 200 mg via INTRAVENOUS
  Filled 2017-10-27: qty 10

## 2017-10-27 NOTE — Telephone Encounter (Signed)
Patient'S Daughter Wilburn Cornelia called and has a question regarding his medication and the Capsules he was given. Please Call. Thanks

## 2017-10-27 NOTE — Telephone Encounter (Signed)
Spoke with Nicholas Hodges - she states that pharmacy gave her dilantin instead of Lamictal.    Called pharmacy - they states that it was an oversight on their part, since pt had just picked up Lamictal Rx on 10/19/17 the pharmacy just assumed pt's daughter was picking up dilantin.    Returned call to pt's daughter and made her aware that Lamictal ready for pick up.

## 2017-10-29 LAB — URINE CULTURE: Culture: >=100000 — AB

## 2017-11-03 ENCOUNTER — Inpatient Hospital Stay: Payer: Medicare Other

## 2017-11-03 VITALS — BP 109/68 | HR 83 | Temp 98.0°F | Resp 18

## 2017-11-03 DIAGNOSIS — D509 Iron deficiency anemia, unspecified: Secondary | ICD-10-CM

## 2017-11-03 MED ORDER — IRON SUCROSE 20 MG/ML IV SOLN
200.0000 mg | Freq: Once | INTRAVENOUS | Status: AC
  Administered 2017-11-03: 200 mg via INTRAVENOUS
  Filled 2017-11-03: qty 10

## 2017-11-03 MED ORDER — SODIUM CHLORIDE 0.9 % IV SOLN
Freq: Once | INTRAVENOUS | Status: AC
  Administered 2017-11-03: 15:00:00 via INTRAVENOUS
  Filled 2017-11-03: qty 1000

## 2017-11-09 ENCOUNTER — Ambulatory Visit (INDEPENDENT_AMBULATORY_CARE_PROVIDER_SITE_OTHER): Admitting: Gastroenterology

## 2017-11-09 ENCOUNTER — Encounter: Payer: Self-pay | Admitting: Gastroenterology

## 2017-11-09 VITALS — BP 103/65 | HR 72 | Ht 63.5 in | Wt 112.0 lb

## 2017-11-09 DIAGNOSIS — D509 Iron deficiency anemia, unspecified: Secondary | ICD-10-CM

## 2017-11-09 NOTE — Progress Notes (Signed)
Jonathon Bellows MD, MRCP(U.K) [address]  [place]  [place], [place] [address]  Main: [phone]  Fax: [phone]   Gastroenterology Consultation  Referring Provider:     Remi Haggard, FNP Primary Care Physician:  Remi Haggard, FNP Primary Gastroenterologist:  Dr. Jonathon Bellows  Reason for Consultation: iron deficiency anemia         HPI:   Nicholas Hodges is a 77 y.o. y/o male referred for consultation & management  by Dr. Lavena Bullion, Jordan Likes, FNP.    He sees Dr Tasia Catchings for iron deficiency anemia. Receives IV iron .   Rectal bleeding: seen some blood in the diaper in the front area (started on antibiotics for UTI) Nose bleeds: none  Hematemesis or hemoptysis : no  Blood in urine : yes isolated on one urine specimen.    Never had a colonoscopy , never had an EGD, no weight loss, rather gained weight. No change in bowel habits.  CBC Latest Ref Rng & Units 10/14/2017 10/14/2017 08/17/2017  WBC 4.0 - 10.5 K/uL - 3.5(L) 6.7  Hemoglobin 13.0 - 17.0 g/dL 14.3 9.4(L) 11.8(L)  Hematocrit 39.0 - 52.0 % 42.0 29.4(L) 37.5(L)  Platelets 150 - 400 K/uL - PLATELET CLUMPS NOTED ON SMEAR, UNABLE TO ESTIMATE 36   Per family he has dementia that is rapidly progressing      Past Medical History:  Diagnosis Date  . Anemia   . Arthritis    "knees" (08/17/2017)  . Brain tumor (benign) ([place])   . Dementia   . Epilepsy ([place])   . GERD (gastroesophageal reflux disease)   . Headache    "w/seizures and probably a couple monthly without seizures" (08/17/2017)  . Hyperlipidemia   . Hypertension   . 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 Mitral valve disorder    "it leaks" (08/17/2017)  . Myocarditis ([place])   . S/P VP shunt   . Skull fracture ([place])   . Subacute bacterial endocarditis     Past Surgical History:  Procedure Laterality Date  . BRAIN TUMOR EXCISION  1956  . BUNIONECTOMY Right   . CARDIAC CATHETERIZATION    . 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 VENTRICULOPERITONEAL SHUNT  1956    Prior to Admission medications   Medication Sig Start Date End Date Taking? Authorizing Provider  aspirin EC 81 MG tablet Take 81 mg by mouth daily.    [provider]  Calcium Lactate 648 MG TABS Take 648 mg by mouth at bedtime.     [provider]  ibuprofen (ADVIL,MOTRIN) 800 MG tablet Take 800 mg by mouth every 8 (eight) hours as needed (pain).     [provider]  lamoTRIgine (LAMICTAL) 100 MG tablet Take 1 tablet (100 mg total) by mouth daily. 10/26/17   Cameron Sprang, MD  Melatonin 10 MG TABS Take 10 mg by mouth at bedtime.    [provider]  nitrofurantoin, macrocrystal-monohydrate, (MACROBID) 100 MG capsule Take 1 capsule (100 mg total) by mouth 2 (two) times daily. 10/27/17   Earlie Server, MD  omeprazole (PRILOSEC) 20 MG capsule Take 20 mg by mouth daily.  04/05/17   [provider]  Riboflavin (B-2) 100 MG TABS Take 100 mg by mouth daily.     [provider]    Family History  Problem Relation Age of Onset  .  Aneurysm Mother   . Dementia Sister   . Stomach cancer Sister   . Colon cancer Sister      Social History   Tobacco Use  . Smoking status: Never Smoker  . Smokeless tobacco: Never Used  Substance Use Topics  . Alcohol use: No  . Drug use: No    Allergies as of 11/09/2017  . (No Known Allergies)    Review of Systems:    Patient unable to provide ROS   Physical Exam:  BP 103/65 (BP Location: Left Arm, Patient Position: Sitting, Cuff Size: Normal)   Pulse 72   Ht 5' 3.5" (1.613 m)   Wt 112 lb (50.8 kg)   BMI 19.53 kg/m  No LMP for male patient. Psych:  Comfortable  General: comfortable  Head:  Normocephalic and atraumatic. Eyes:  Sclera clear, no icterus.   Conjunctiva pink. Ears:hard of hearing  Nose:  No deformity, discharge, or lesions. Mouth:  No deformity or lesions,oropharynx pink & moist. Neck:  Supple; no masses or  thyromegaly. Lungs:  Respirations even and unlabored.  Clear throughout to auscultation.   No wheezes, crackles, or rhonchi. No acute distress. Heart:  Regular rate and rhythm; no murmurs, clicks, rubs, or gallops. Abdomen:  Normal bowel sounds.  No bruits.  Soft, non-tender and non-distended without masses, hepatosplenomegaly or hernias noted.  No guarding or rebound tenderness.    Extremities:  No clubbing or edema.  No cyanosis. Neurologic:  Alert and oriented x0. Skin:  Intact without significant lesions or rashes. No jaundice. Lymph Nodes:  No significant cervical adenopathy.   Imaging Studies: Dg Chest 2 View  Result Date: 10/14/2017 CLINICAL DATA:  Shortness of breath EXAM: CHEST - 2 VIEW COMPARISON:  08/16/2017 FINDINGS: Unchanged chronic elevation of the right hemidiaphragm. No focal consolidation. No pneumothorax or pleural effusion. IMPRESSION: Unchanged elevation of the right hemidiaphragm without acute airspace disease. Electronically Signed   By: Ulyses Jarred M.D.   On: 10/14/2017 15:31   Ct Head Wo Contrast  Result Date: 10/14/2017 CLINICAL DATA:  TIA, altered consciousness.  Shunt. EXAM: CT HEAD WITHOUT CONTRAST TECHNIQUE: Contiguous axial images were obtained from the base of the skull through the vertex without intravenous contrast. COMPARISON:  08/16/2017 FINDINGS: Brain: Advanced atrophy. Marked ventricular enlargement stable. Ventricular shunt on the right extends into the right temporal horn unchanged in position. Postop craniotomy on the right. 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 Encephalomalacia in the cerebellum bilaterally with diffuse cerebellar atrophy. Negative for acute infarct or hemorrhage.  Negative for mass lesion. Vascular: Negative for hyperdense vessel Skull: Suboccipital craniectomy unchanged. Right temporal craniotomy Sinuses/Orbits: Right cataract removal. No orbital mass. Paranasal sinuses  clear. Other: None IMPRESSION: No change from the prior study. Generalized atrophy. Marked ventricular enlargement stable. Right ventricular catheter unchanged in position. Electronically Signed   By: Franchot Gallo M.D.   On: 10/14/2017 15:50    Assessment and Plan:   ALCARIO TINKEY is a 77 y.o. y/o male has been referred for iron deficiency anemia. No overt blood loss, Here with family , patient has dementia and rapidly progressing , family said the patient wouldn't have wanted an extensive work up or evaluation. Explained we could symptomatically treat with IV  Iron and not pursue further evaluation and there is a change that there may be underlying cancer undiagnosed. They expressed concern regarding a bowel prep for him and putting him through these procedures of an EGD and colonoscopy. At present they are  not keen and will call us back if they change their mind    Follow up as needed   Dr Jonathon Bellows MD,MRCP(U.K)

## 2017-11-10 ENCOUNTER — Inpatient Hospital Stay: Payer: Medicare Other | Attending: Oncology

## 2017-11-10 VITALS — BP 117/74 | HR 70 | Resp 20

## 2017-11-10 DIAGNOSIS — R413 Other amnesia: Secondary | ICD-10-CM | POA: Diagnosis not present

## 2017-11-10 DIAGNOSIS — D509 Iron deficiency anemia, unspecified: Secondary | ICD-10-CM | POA: Insufficient documentation

## 2017-11-10 DIAGNOSIS — R319 Hematuria, unspecified: Secondary | ICD-10-CM | POA: Diagnosis not present

## 2017-11-10 DIAGNOSIS — I10 Essential (primary) hypertension: Secondary | ICD-10-CM | POA: Diagnosis not present

## 2017-11-10 DIAGNOSIS — R569 Unspecified convulsions: Secondary | ICD-10-CM | POA: Insufficient documentation

## 2017-11-10 MED ORDER — IRON SUCROSE 20 MG/ML IV SOLN
200.0000 mg | Freq: Once | INTRAVENOUS | Status: AC
  Administered 2017-11-10: 200 mg via INTRAVENOUS
  Filled 2017-11-10: qty 10

## 2017-11-10 MED ORDER — SODIUM CHLORIDE 0.9 % IV SOLN
Freq: Once | INTRAVENOUS | Status: AC
  Administered 2017-11-10: 15:00:00 via INTRAVENOUS
  Filled 2017-11-10: qty 1000

## 2017-11-17 ENCOUNTER — Inpatient Hospital Stay: Payer: Medicare Other

## 2017-11-17 VITALS — BP 117/66 | HR 74 | Temp 97.2°F | Resp 18

## 2017-11-17 DIAGNOSIS — D509 Iron deficiency anemia, unspecified: Secondary | ICD-10-CM | POA: Diagnosis not present

## 2017-11-17 MED ORDER — SODIUM CHLORIDE 0.9 % IV SOLN
Freq: Once | INTRAVENOUS | Status: AC
  Administered 2017-11-17: 15:00:00 via INTRAVENOUS
  Filled 2017-11-17: qty 1000

## 2017-11-17 MED ORDER — IRON SUCROSE 20 MG/ML IV SOLN
200.0000 mg | Freq: Once | INTRAVENOUS | Status: AC
  Administered 2017-11-17: 200 mg via INTRAVENOUS
  Filled 2017-11-17: qty 10

## 2017-11-17 MED ORDER — DIPHENHYDRAMINE HCL 25 MG PO CAPS: 25.0000 mg | ORAL_CAPSULE | Freq: Once | ORAL | Status: DC

## 2017-11-17 NOTE — Progress Notes (Signed)
After administering Venofer, Patient's son just said that after the last treatment both hands were numb the afternoon of treatment and the day after. Dr. Tasia Catchings at chairside to speak with pt. Per Dr. Tasia Catchings okay to discharge pt home, pt to call clinic if symptoms occur again and no further orders at this time. Pt and patient's son verbalize understanding.  Pt and VS stable at discharge.

## 2017-11-18 ENCOUNTER — Ambulatory Visit: Payer: Medicare Other | Admitting: Neurology

## 2017-11-30 ENCOUNTER — Other Ambulatory Visit: Payer: Self-pay

## 2017-11-30 ENCOUNTER — Inpatient Hospital Stay: Payer: Medicare Other

## 2017-11-30 DIAGNOSIS — D649 Anemia, unspecified: Secondary | ICD-10-CM

## 2017-11-30 DIAGNOSIS — D509 Iron deficiency anemia, unspecified: Secondary | ICD-10-CM

## 2017-12-01 ENCOUNTER — Inpatient Hospital Stay: Payer: Medicare Other

## 2017-12-01 ENCOUNTER — Encounter: Payer: Self-pay | Admitting: Oncology

## 2017-12-01 ENCOUNTER — Other Ambulatory Visit: Payer: Self-pay

## 2017-12-01 ENCOUNTER — Inpatient Hospital Stay (HOSPITAL_BASED_OUTPATIENT_CLINIC_OR_DEPARTMENT_OTHER): Payer: Medicare Other | Admitting: Oncology

## 2017-12-01 VITALS — BP 131/75 | HR 80 | Temp 97.6°F | Resp 16 | Ht 63.0 in

## 2017-12-01 DIAGNOSIS — I10 Essential (primary) hypertension: Secondary | ICD-10-CM

## 2017-12-01 DIAGNOSIS — D509 Iron deficiency anemia, unspecified: Secondary | ICD-10-CM | POA: Diagnosis not present

## 2017-12-01 DIAGNOSIS — R319 Hematuria, unspecified: Secondary | ICD-10-CM | POA: Diagnosis not present

## 2017-12-01 DIAGNOSIS — R569 Unspecified convulsions: Secondary | ICD-10-CM | POA: Diagnosis not present

## 2017-12-01 DIAGNOSIS — R413 Other amnesia: Secondary | ICD-10-CM

## 2017-12-01 LAB — CBC WITH DIFFERENTIAL/PLATELET
BASOS ABS: 0.1 10*3/uL (ref 0–0.1)
BASOS PCT: 1 %
EOS ABS: 0.2 10*3/uL (ref 0–0.7)
EOS PCT: 2 %
HCT: 45.1 % (ref 40.0–52.0)
Hemoglobin: 15.4 g/dL (ref 13.0–18.0)
LYMPHS PCT: 16 %
Lymphs Abs: 1.5 10*3/uL (ref 1.0–3.6)
MCH: 32.3 pg (ref 26.0–34.0)
MCHC: 34 g/dL (ref 32.0–36.0)
MCV: 94.9 fL (ref 80.0–100.0)
Monocytes Absolute: 0.5 10*3/uL (ref 0.2–1.0)
Monocytes Relative: 5 %
Neutro Abs: 7.3 10*3/uL — ABNORMAL HIGH (ref 1.4–6.5)
Neutrophils Relative %: 76 %
PLATELETS: 344 10*3/uL (ref 150–440)
RBC: 4.76 MIL/uL (ref 4.40–5.90)
RDW: 15.8 % — ABNORMAL HIGH (ref 11.5–14.5)
WBC: 9.6 10*3/uL (ref 3.8–10.6)

## 2017-12-01 LAB — COMPREHENSIVE METABOLIC PANEL
ALBUMIN: 4 g/dL (ref 3.5–5.0)
ALK PHOS: 91 U/L (ref 38–126)
ALT: 17 U/L (ref 17–63)
AST: 25 U/L (ref 15–41)
Anion gap: 10 (ref 5–15)
BILIRUBIN TOTAL: 0.4 mg/dL (ref 0.3–1.2)
BUN: 16 mg/dL (ref 6–20)
CALCIUM: 9.4 mg/dL (ref 8.9–10.3)
CO2: 26 mmol/L (ref 22–32)
Chloride: 100 mmol/L — ABNORMAL LOW (ref 101–111)
Creatinine, Ser: 0.92 mg/dL (ref 0.61–1.24)
GFR calc Af Amer: >60 mL/min (ref >60)
GLUCOSE: 98 mg/dL (ref 65–99)
POTASSIUM: 4.4 mmol/L (ref 3.5–5.1)
Sodium: 136 mmol/L (ref 135–145)
TOTAL PROTEIN: 7.3 g/dL (ref 6.5–8.1)

## 2017-12-01 LAB — IRON AND TIBC
Iron: 70 ug/dL (ref 45–182)
SATURATION RATIOS: 26 % (ref 17.9–39.5)
TIBC: 274 ug/dL (ref 250–450)
UIBC: 204 ug/dL

## 2017-12-01 LAB — FERRITIN: FERRITIN: 155 ng/mL (ref 24–336)

## 2017-12-01 MED ORDER — FERROUS SULFATE 325 (65 FE) MG PO TBEC: 325.0000 mg | DELAYED_RELEASE_TABLET | Freq: Two times a day (BID) | ORAL | 3 refills | Status: DC

## 2017-12-01 NOTE — Progress Notes (Signed)
Patient here for follow up. Daughter reports he is always cold. No other complaints today.

## 2017-12-01 NOTE — Progress Notes (Signed)
Hematology/Oncology Follow up note [facility] [phone] [phone]   Patient Care Team: Remi Haggard, FNP as PCP - General (Family Medicine)  REFERRING PROVIDER: Remi Haggard, FNP CHIEF COMPLAINTS/PURPOSE OF CONSULTATION:  Evaluation of iron deficiency anemia.   HISTORY OF PRESENTING ILLNESS:  Nicholas Hodges is a  77 y.o.  male with PMH listed below who was referred to me for evaluation of iron deficiency anemia.  Patient had lab work up recently during his ER visit for seizure vs TIA symptoms,family reports patient has had stroked work up recently, in ED, was found that Echo and carotid doppler were not done. Of note, patient and family members did not want inpatient work up so her was discharged to follow up with PCP.  CT head 10/14/2017 no acute changes.  In ED,  Lab on 10/14/2017 showed mild leukopenia with wbc 3.5, hemoglobin 9.4,decreased from his baseline of 11.8 which was a month ago. Iron panel showed TIBC 304, ferritin 14, % 13. B12 was 403.  Denies any blood in stool or black tarry stool.  Patient denies having fatigue. Family reports he has good appetite. Never had any colonoscopy. He had dementia. Lives with wife, daughter and son in law.   INTERVAL HISTORY Nicholas Hodges is a 77 y.o. male who has above history reviewed by me today presents for follow up visit for management of iron deficiency anemia.  He has had IV venofer x 4 during the interval.  He was also referred to GI and was evaluated by Dr.Anna. He declined endoscopy.  Patient feels a lot better with less fatigue. Appetite is good.   Review of Systems  Constitutional: Negative for chills, fever, malaise/fatigue and weight loss.  HENT: Negative for congestion, ear discharge, ear pain, hearing loss, nosebleeds, sinus pain and sore throat.   Eyes: Negative for double vision, photophobia, pain, discharge and redness.  Respiratory: Negative for cough, hemoptysis, sputum  production, shortness of breath and wheezing.   Cardiovascular: Negative for chest pain, palpitations, orthopnea, claudication and leg swelling.  Gastrointestinal: Negative for abdominal pain, blood in stool, heartburn, melena, nausea and vomiting.  Genitourinary: Negative for dysuria, flank pain, frequency, hematuria and urgency.  Musculoskeletal: Negative for back pain, joint pain, myalgias and neck pain.  Skin: Negative for itching and rash.  Neurological: Positive for seizures. Negative for dizziness, tingling, tremors, sensory change, focal weakness, weakness and headaches.  Endo/Heme/Allergies: Negative for environmental allergies. Does not bruise/bleed easily.  Psychiatric/Behavioral: Positive for memory loss. Negative for depression, substance abuse and suicidal ideas.    MEDICAL HISTORY:  Past Medical History:  Diagnosis Date  . Anemia   . Arthritis    "knees" (08/17/2017)  . Brain tumor (benign) ([place])   . Dementia   . Epilepsy ([place])   . GERD (gastroesophageal reflux disease)   . Headache    "w/seizures and probably a couple monthly without seizures" (08/17/2017)  . Hyperlipidemia   . Hypertension   . 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 Mitral valve disorder    "it leaks" (08/17/2017)  . Myocarditis ([place])   . S/P VP shunt   . Skull fracture ([place])   . Subacute bacterial endocarditis     SURGICAL HISTORY: Past Surgical History:  Procedure Laterality Date  . BRAIN TUMOR EXCISION  1956  . BUNIONECTOMY Right   . CARDIAC CATHETERIZATION    . 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 VENTRICULOPERITONEAL SHUNT  1956    SOCIAL HISTORY: Social History   Socioeconomic History  . Marital status: Married    Spouse name: Not on file  . Number of children: Not on file  . Years of education: Not on file  . Highest education level: Not on file  Occupational History  . Not on file  Social Needs  . Financial  resource strain: Not on file  . Food insecurity:    Worry: Not on file    Inability: Not on file  . Transportation needs:    Medical: Not on file    Non-medical: Not on file  Tobacco Use  . Smoking status: Never Smoker  . Smokeless tobacco: Never Used  Substance and Sexual Activity  . Alcohol use: No  . Drug use: No  . Sexual activity: Not Currently  Lifestyle  . Physical activity:    Days per week: Not on file    Minutes per session: Not on file  . Stress: Not on file  Relationships  . Social connections:    Talks on phone: Not on file    Gets together: Not on file    Attends religious service: Not on file    Active member of club or organization: Not on file    Attends meetings of clubs or organizations: Not on file    Relationship status: Not on file  . Intimate partner violence:    Fear of current or ex partner: Not on file    Emotionally abused: Not on file    Physically abused: Not on file    Forced sexual activity: Not on file  Other Topics Concern  . Not on file  Social History Narrative   Pt and wife live with daughter and her family (spouse and 2 adult children) in 2 story home   Highest level of education; 8th grade   Retired Administrator, sports for [employer]   Has 2 children    FAMILY HISTORY: Family History  Problem Relation Age of Onset  . Aneurysm Mother   . Dementia Sister   . Stomach cancer Sister   . Colon cancer Sister     ALLERGIES:  has No Known Allergies.  MEDICATIONS:  Current Outpatient Medications  Medication Sig Dispense Refill  . aspirin EC 81 MG tablet Take 81 mg by mouth daily.    . Calcium Lactate 648 MG TABS Take 648 mg by mouth at bedtime.     Marland Kitchen ibuprofen (ADVIL,MOTRIN) 800 MG tablet Take 800 mg by mouth every 8 (eight) hours as needed (pain).     Marland Kitchen lamoTRIgine (LAMICTAL) 100 MG tablet Take 1 tablet (100 mg total) by mouth daily. 30 tablet 0  . Melatonin 10 MG TABS Take 10 mg by mouth at bedtime.    . nitrofurantoin,  macrocrystal-monohydrate, (MACROBID) 100 MG capsule Take 1 capsule (100 mg total) by mouth 2 (two) times daily. (Patient not taking: Reported on 11/09/2017) 14 capsule 0  . omeprazole (PRILOSEC) 20 MG capsule Take 20 mg by mouth daily.     . Riboflavin (B-2) 100 MG TABS Take 100 mg by mouth daily.      No current facility-administered medications for this visit.      PHYSICAL EXAMINATION: ECOG PERFORMANCE STATUS: 2 - Symptomatic, <50% confined to bed Vitals:   12/01/17 1113 12/01/17 1116  BP:  131/75  Pulse:  80  Resp: 16   Temp:  97.6 F (36.4 C)   Filed  Weights    Physical Exam  Constitutional: He is oriented to person, place, and time and well-developed, well-nourished, and in no distress. No distress.  HENT:  Head: Normocephalic.  Right Ear: External ear normal.  Left Ear: External ear normal.  Mouth/Throat: Oropharynx is clear and moist. No oropharyngeal exudate.  Eyes: Pupils are equal, round, and reactive to light. EOM are normal. Left eye exhibits no discharge. No scleral icterus.  Neck: Normal range of motion. Neck supple. No tracheal deviation present.  Cardiovascular: Normal rate, regular rhythm and normal heart sounds. Exam reveals no gallop.  No murmur heard. Pulmonary/Chest: Effort normal and breath sounds normal. No respiratory distress. He has no wheezes.  Abdominal: Soft. Bowel sounds are normal. He exhibits no distension. There is no tenderness. There is no rebound.  Musculoskeletal: Normal range of motion. He exhibits no edema or deformity.  Lymphadenopathy:    He has no cervical adenopathy.  Neurological: He is alert and oriented to person, place, and time. No cranial nerve deficit. Coordination normal.  Skin: Skin is warm. He is not diaphoretic. No erythema.  Psychiatric: Affect normal.  Demented.      LABORATORY DATA:  I have reviewed the data as listed Lab Results  Component Value Date   WBC 9.6 12/01/2017   HGB 15.4 12/01/2017   HCT 45.1  12/01/2017   MCV 94.9 12/01/2017   PLT 344 12/01/2017   Recent Labs    08/17/17 0425 10/14/17 1444 10/14/17 1516 12/01/17 1047  NA 140 140 137 136  K 3.8 5.1 7.1* 4.4  CL 112* 110 108 100*  CO2 22 20*  --  26  GLUCOSE 86 79 79 98  BUN 9 13 18 16   CREATININE 0.78 0.80 0.70 0.92  CALCIUM 7.9* 8.0*  --  9.4  GFRNONAA >60 >60  --  >60  GFRAA >60 >60  --  >60  PROT 4.8* 5.3*  --  7.3  ALBUMIN 2.4* 3.2*  --  4.0  AST 30 40  --  25  ALT 11* 17  --  17  ALKPHOS 86 86  --  91  BILITOT 0.5 1.3*  --  0.4    Urine dipstick negative for hgb   ASSESSMENT & PLAN:  1. Iron deficiency anemia, unspecified iron deficiency anemia type   2. Hematuria, unspecified type    # s/p Venofer 200mg  weekly for 4 doses. Hemoglobin improved. Iron panel showed good response.  Hold additional wok up for now. Since bleeding resource is not discovered as patient is not interested in extensive work up, advise patient to take Ferrous sulfate 325mg  BID along with colace as needed.   # Hematuria, due to UTI and was treated with a course of antibiotics. I called daughter and will repeat another UA to see if any blood in urine.   # B12 level at 400s, borderline, check MMA.    All questions were answered. The patient knows to call the clinic with any problems questions or concerns.  Return of visit: 4 months.  Thank you for this kind referral and the opportunity to participate in the care of this patient. A copy of today's note is routed to referring provider    Earlie Server, MD, PhD Hematology Oncology [facility] Pager- [phone] 12/01/2017

## 2017-12-02 NOTE — Addendum Note (Signed)
Addended by: Earlie Server on: 12/02/2017 06:06 PM   Modules accepted: Orders

## 2017-12-03 LAB — METHYLMALONIC ACID, SERUM: METHYLMALONIC ACID, QUANTITATIVE: 250 nmol/L (ref 0–378)

## 2017-12-17 ENCOUNTER — Telehealth: Payer: Self-pay | Admitting: Neurology

## 2017-12-17 NOTE — Telephone Encounter (Signed)
Resheda called from Hospice for this patient. He is needing a refill on his Seizure medication Lamotrigine 100 MG 1/2 Tab each day and 200 MG Same medication. [facility]. Thanks

## 2017-12-21 ENCOUNTER — Other Ambulatory Visit: Payer: Self-pay

## 2017-12-21 MED ORDER — LAMOTRIGINE 100 MG PO TABS: 100.0000 mg | ORAL_TABLET | Freq: Every day | ORAL | 11 refills | Status: DC

## 2017-12-21 NOTE — Telephone Encounter (Signed)
Rx sent 

## 2018-01-05 ENCOUNTER — Other Ambulatory Visit: Payer: Self-pay

## 2018-01-05 ENCOUNTER — Encounter: Payer: Self-pay | Admitting: Neurology

## 2018-01-05 ENCOUNTER — Ambulatory Visit (INDEPENDENT_AMBULATORY_CARE_PROVIDER_SITE_OTHER): Payer: Self-pay | Admitting: Neurology

## 2018-01-05 VITALS — BP 98/62 | HR 80 | Wt 112.0 lb

## 2018-01-05 DIAGNOSIS — G40211 Localization-related (focal) (partial) symptomatic epilepsy and epileptic syndromes with complex partial seizures, intractable, with status epilepticus: Secondary | ICD-10-CM

## 2018-01-05 MED ORDER — LAMOTRIGINE 200 MG PO TABS: ORAL_TABLET | ORAL | 3 refills | Status: DC

## 2018-01-05 MED ORDER — LAMOTRIGINE 100 MG PO TABS: ORAL_TABLET | ORAL | 3 refills | Status: DC

## 2018-01-05 NOTE — Progress Notes (Signed)
NEUROLOGY FOLLOW UP OFFICE NOTE  Nicholas Hodges [id number] 1941-05-02  HISTORY OF PRESENT ILLNESS: I had the pleasure of seeing Nicholas Hodges in follow-up in the neurology clinic on 01/07/2018.  The patient was last seen 4 months ago for seizures. He is again accompanied by his daughter and son-in-law who help supplement the history today. Since his last visit, his daughter called in March 2019 to report 2 seizures occurring a week apart. His Hgb level was also low and he was started on iron infusions. Family was instructed to increase his Lamotrigine, he is currently on 250mg  BID (takes 200mg  tab BID with additional 100mg  1/2 tab BID). Since then, he has been doing very well. He is again pleasant and quite interactive in the office today. Family denies any seizures or seizure-like symptoms. His appetite and sleep are good. He denies any headaches, dizziness, focal numbness/tingling/weakness, no falls.  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 He is total care at baseline, family helps him with dressing and bathing, and started noticing that he would be leaning forward and was more wobbly recently. His daughter bought him new shoes, and when she tried to put it on last 8/26, noticed that he was not reacting when his feet were touched, he would usually be very ticklish. His son-in-law put him in the tub for a bath, and he reported that he could not feel patchy areas on his left leg. Family also noticed his speech was slurred and he was more disoriented (could not remember her name). He was brought to the ER, and daughter reports by that time, he was back to baseline, not wanting staff to touch his feet. Family stays with him 24/7 and had not witnessed any seizure  activity. In the ER, he was noted to have a supratherapeutic Dilantin level of 32. Records in the ER indicate he was on 300mg  TID, but his wife and daughter today confirm repeatedly that he has been on 100mg  TID Dilantin for many years with no change in dose, no new medications added. His wife gives his medications. CBC, BMP were unremarkable except for mildly low sodium of 132. Urinalysis negative. I personally reviewed head CT without contrast which did not show any acute changes, there was severe atrophy with lateral ventriculomegaly, right frontal approach ventriculostomy catheter with tip in the temporal horn of the right lateral ventricle. 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 Family was instructed to hold Dilantin until Neurology follow-up.  His family reports he has been doing well since hospital discharge, with no further slurred speech. There is some delay in answering questions, but he appears to be able to answer appropriately when asked about dizziness ("some, not all the time"). His vision is "sometimes blurry, sometimes clear," and states it is blurry today. He denies any neck/back pain, no paresthesias. His wife reports that he continues to have seizures every 2 months or so, he would start staring, get stiff with twitching, with head turn to the left. Seizures last 1-2 minutes, he is drowsy after. He wears adult diapers but family reports he has incontinence with the seizures. This year he has had around 4 seizures. His wife denies any other prior AEDs. They  started noticing memory changes for the past several years, he would forget little things, or put shoes on the wrong foot. Family now puts on his clothes and bathes him. He usually repeats himself. They report sleep and appetite are good. His older sister had dementia.   Update 05/14/2017: Over the past 1.5 weeks, he had a significant change in mental status. They  saw his PCP yesterday who felt that he may be overmedicated due to Dilantin and Lamotrigine combination. Over the past 1.5 weeks, his family reports that he just wants to lay in bed and does not want to do anything anymore. He used to be able to feed himself, but now his wife feeds him. He has to be carried to the commode. They have noticed a decrease in his urine output, previously they had to change his sheets a lot, but now the adult diapers are less wet. They have not noticed any change in urine color or smell. His BP has been running low. No fever per family. He also states he is dizzy when family tries to stand or sit him up. They have not witnessed any of his typical seizures with body stiffening. He has been hallucinating, speaking very loudly looking at the ceiling or asking what is on the floor. He sees a little boy in a white hat or that someone bit his head. He told his wife he was dying. He has not been complaining of any headaches, no nausea/vomiting. When he eats, appetite is good but he has more difficulties swallowing. He is noted to have some right leg jerking in the office, his daughter reports he has always done that.   Update 07/05/2017: Family was given instructions to start Lamotrigine and start tapering down Dilantin. His daughter had confirmed during his visit on 05/14/17 that he was taking Lamictal 2 tabs twice a day and Dilantin 1 cap daily. On that visit, family reported a significant change in mental status, he was unable to feed himself and needed more assistance with transfers. Family denied any seizures, but reported hallucinations. He had an EEG done in the office which did not show any electrographic seizure, there was moderate diffuse background slowing and frequent right mid-temporal epileptiform discharges. He was sent to the ER for evaluation, head CT without contrast did not show any acute changes, there were post-surgical changes from the occipital and right temporal  craniotomies with areas of resection and encephalomalacia at the anterior right temporal lobe and at the posterior fossa. His Dilantin level was supratherapeutic at 46.2. I spoke to his daughter again and she then reported that they were giving the medications with instructions in reverse, Dilantin 2 caps BID and Lamictal 1 tab daily, thus causing the significant increase in Dilantin level. Dilantin was held in the ER, I instructed her to continue on discontinuing the Dilantin and start uptitrating the Lamictal. He is now on Lamictal 100mg  BID without side effects. He is much improved today, interactive, able to follow commands. His family reports that he is feeding himself and has gained weight. Since his last visit, he has had one witnessed convulsion with head deviation to the left lasting 5 minutes. It took him 30-45 minutes to recover. He occasional gets dizzy with transfers, no nausea/vomiting.   Update 08/25/2017:  He was admitted to [facility] last 08/16/17 after a prolonged seizure when his wife was awoken by repeated swallowing followed by a 20-minute GTC followed by right gaze deviation and left-sided weakness. His EEG showed right-sided slowing  and right temporal sharp waves. MRI brain no changes from prior MRI with right temporal encephalomalacia, shunt in place with chronic ventriculomegaly. He was close to baseline the next day. Lamictal level was 7.8. Family was instructed to increase Lamotrigine to 200mg  BID. Aside from the prolonged seizure in January, he had one small seizure in November that lasted 5 minutes. They report that he was congested while in the hospital.    PAST MEDICAL HISTORY: Past Medical History:  Diagnosis Date  . Anemia   . Arthritis    "knees" (08/17/2017)  . Brain tumor (benign) ([place])   . Dementia   . Epilepsy ([place])   . GERD (gastroesophageal reflux disease)   . Headache    "w/seizures and probably a couple monthly without seizures" (08/17/2017)  . Hyperlipidemia   .  Hypertension   . Melanoma of nose ([place])   . Mitral valve disorder    "it leaks" (08/17/2017)  . Myocarditis ([place])   . S/P VP shunt   . Skull fracture ([place])   . Subacute bacterial endocarditis     MEDICATIONS:  Outpatient Encounter Medications as of 01/05/2018  Medication Sig  . aspirin EC 81 MG tablet Take 81 mg by mouth daily.  . Calcium Lactate 648 MG TABS Take 648 mg by mouth at bedtime.   . ferrous sulfate 325 (65 FE) MG EC tablet Take 1 tablet (325 mg total) by mouth 2 (two) times daily with a meal.  . ibuprofen (ADVIL,MOTRIN) 800 MG tablet Take 800 mg by mouth every 8 (eight) hours as needed (pain).   Marland Kitchen lamoTRIgine (LAMICTAL) 100 MG tablet Take 1/2 tablet twice a day (take with Lamotrigine 200mg  1tab twice a day for a total of 250mg  twice a day)  . Melatonin 10 MG TABS Take 10 mg by mouth at bedtime.  . nitrofurantoin, macrocrystal-monohydrate, (MACROBID) 100 MG capsule Take 1 capsule (100 mg total) by mouth 2 (two) times daily.  Marland Kitchen omeprazole (PRILOSEC) 20 MG capsule Take 20 mg by mouth daily.   . Riboflavin (B-2) 100 MG TABS Take 100 mg by mouth daily.   . [DISCONTINUED] lamoTRIgine (LAMICTAL) 100 MG tablet Take 1 tablet (100 mg total) by mouth daily.  Marland Kitchen lamoTRIgine (LAMICTAL) 200 MG tablet Take 1 tablet twice a day (take with Lamotrigine 100mg  1/2 tab twice a day for a total of 250mg  twice a day)   No facility-administered encounter medications on file as of 01/05/2018.      ALLERGIES: No Known Allergies  FAMILY HISTORY: Family History  Problem Relation Age of Onset  . Aneurysm Mother   . Dementia Sister   . Stomach cancer Sister   . Colon cancer Sister     SOCIAL HISTORY: Social History   Socioeconomic History  . Marital status: Married    Spouse name: Not on file  . Number of children: Not on file  . Years of education: Not on file  . Highest education level: Not on file  Occupational History  . Not on file  Social Needs  . Financial resource strain: Not on  file  . Food insecurity:    Worry: Not on file    Inability: Not on file  . Transportation needs:    Medical: Not on file    Non-medical: Not on file  Tobacco Use  . Smoking status: Never Smoker  . Smokeless tobacco: Never Used  Substance and Sexual Activity  . Alcohol use: No  . Drug use: No  . Sexual activity: Not Currently  Lifestyle  . Physical activity:    Days per week: Not on file    Minutes per session: Not on file  . Stress: Not on file  Relationships  . Social connections:    Talks on phone: Not on file    Gets together: Not on file    Attends religious service: Not on file    Active member of club or organization: Not on file    Attends meetings of clubs or organizations: Not on file    Relationship status: Not on file  . Intimate partner violence:    Fear of current or ex partner: Not on file    Emotionally abused: Not on file    Physically abused: Not on file    Forced sexual activity: Not on file  Other Topics Concern  . Not on file  Social History Narrative   Pt and wife live with daughter and her family (spouse and 2 adult children) in 2 story home   Highest level of education; 8th grade   Retired Administrator, sports for [employer]   Has 2 children    REVIEW OF SYSTEMS:  Constitutional: No fevers, chills, or sweats, no generalized fatigue, change in appetite Eyes: No visual changes, double vision, eye pain Ear, nose and throat: No hearing loss, ear pain, nasal congestion, sore throat Cardiovascular: No chest pain, palpitations Respiratory:  No shortness of breath at rest or with exertion, wheezes GastrointestinaI: No nausea, vomiting, diarrhea, abdominal pain, fecal incontinence Genitourinary:  No dysuria, urinary retention or frequency Musculoskeletal:  No neck pain, back pain Integumentary: No rash, pruritus, skin lesions Neurological: as above Psychiatric: No depression, insomnia, anxiety Endocrine: No palpitations, fatigue, diaphoresis, mood  swings, change in appetite, change in weight, increased thirst Hematologic/Lymphatic:  No anemia, purpura, petechiae. Allergic/Immunologic: no itchy/runny eyes, nasal congestion, recent allergic reactions, rashes  PHYSICAL EXAM: Vitals:   01/05/18 1455  BP: 98/62  Pulse: 80  SpO2: 95%   General: No acute distress, sitting on wheelchair, interactive, smiling, following simple commands Head: Normocephalic/atraumatic Neck: supple, no paraspinal tenderness, full range of motion Heart:  Regular rate and rhythm Lungs:  Clear to auscultation bilaterally Back: No paraspinal tenderness Skin/Extremities: No rash, no edema Neurological Exam: alert and oriented to person, place. He is able to follow simple commands. Able to name, repeat. Cranial nerves: Pupils equal, round, reactive to light.  Extraocular movements intact with no nystagmus. Visual fields full. No facial asymmetry, edentulous. Tongue, uvula, palate midline.  Motor: Bulk and tone normal, no cogwheeling, moves all extremities symmetrically. Sensation intact to light touch. No incoordination on finger to nose testing. Gait not tested.   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 His wife continued to report seizures every few months while on the Dilantin. He has been weaned off Dilantin and is now on Lamotrigine monotherapy. Last seizure was 10/25/17, Lamotrigine dose increased to 250mg  BID, no side effects. He is back to baseline and doing well. He will follow-up in 6 months, family knows to call for any changes.   Thank you for allowing me to participate in his care.  Please do not hesitate to call for any questions or concerns.  The duration of this appointment visit was 15 minutes of face-to-face time with the patient.  Greater  than 50% of this time  was spent in counseling, explanation of diagnosis, planning of further management, and coordination of care.   Ellouise Newer, M.D.   CC: Threasa Alpha, FNP

## 2018-01-05 NOTE — Patient Instructions (Signed)
1. Continue Lamictal 200mg  twice a day with Lamictal 100mg  1/2 tablet twice a day, for a total of 250mg  twice a day 2. Follow-up in 6 months, call for any changes  Seizure Precautions: 1. If medication has been prescribed for you to prevent seizures, take it exactly as directed.  Do not stop taking the medicine without talking to your doctor first, even if you have not had a seizure in a long time.   2. Avoid activities in which a seizure would cause danger to yourself or to others.  Don't operate dangerous machinery, swim alone, or climb in high or dangerous places, such as on ladders, roofs, or girders.  Do not drive unless your doctor says you may.  3. If you have any warning that you may have a seizure, lay down in a safe place where you can't hurt yourself.    4.  No driving for 6 months from last seizure, as per [facility].   Please refer to the following link on the [place] website for more information: [email or web address]   5.  Maintain good sleep hygiene.   6.  Contact your doctor if you have any problems that may be related to the medicine you are taking.  7.  Call 911 and bring the patient back to the ED if:        A.  The seizure lasts longer than 5 minutes.       B.  The patient doesn't awaken shortly after the seizure  C.  The patient has new problems such as difficulty seeing, speaking or moving  D.  The patient was injured during the seizure  E.  The patient has a temperature over 102 F (39C)  F.  The patient vomited and now is having trouble breathing

## 2018-01-07 ENCOUNTER — Encounter: Payer: Self-pay | Admitting: Neurology

## 2018-04-01 ENCOUNTER — Other Ambulatory Visit: Payer: Self-pay | Admitting: *Deleted

## 2018-04-01 DIAGNOSIS — D509 Iron deficiency anemia, unspecified: Secondary | ICD-10-CM

## 2018-04-04 ENCOUNTER — Inpatient Hospital Stay: Attending: Oncology

## 2018-04-05 ENCOUNTER — Inpatient Hospital Stay

## 2018-04-05 ENCOUNTER — Inpatient Hospital Stay: Admitting: Oncology

## 2018-04-13 ENCOUNTER — Telehealth: Payer: Self-pay | Admitting: Neurology

## 2018-04-13 NOTE — Telephone Encounter (Signed)
Returned call to pt's daughter.  No answer.  LMOM asking for return call.  

## 2018-04-13 NOTE — Telephone Encounter (Signed)
Patient's daughter called and stated she needed to speak with Dr.Aquino or Nurse about how she needs an original letter head note for social security office stating his health condition. Please call her back at [phone] or [phone]. Thanks.

## 2018-06-01 ENCOUNTER — Other Ambulatory Visit: Payer: Self-pay | Admitting: Oncology

## 2018-06-01 NOTE — Telephone Encounter (Signed)
Can you schedule this patient to see me ? Seems that he missed his appt with me ?

## 2018-06-10 ENCOUNTER — Inpatient Hospital Stay: Payer: Medicaid Other | Admitting: Oncology

## 2018-06-17 ENCOUNTER — Inpatient Hospital Stay: Payer: Medicare Other | Attending: Oncology | Admitting: Oncology

## 2018-06-17 ENCOUNTER — Inpatient Hospital Stay: Payer: Medicare Other

## 2018-06-17 ENCOUNTER — Other Ambulatory Visit: Payer: Self-pay

## 2018-06-17 ENCOUNTER — Encounter: Payer: Self-pay | Admitting: Oncology

## 2018-06-17 VITALS — BP 130/78 | HR 89 | Temp 97.1°F | Resp 18 | Wt 113.6 lb

## 2018-06-17 DIAGNOSIS — I10 Essential (primary) hypertension: Secondary | ICD-10-CM | POA: Diagnosis not present

## 2018-06-17 DIAGNOSIS — D509 Iron deficiency anemia, unspecified: Secondary | ICD-10-CM | POA: Insufficient documentation

## 2018-06-17 DIAGNOSIS — Z85820 Personal history of malignant melanoma of skin: Secondary | ICD-10-CM | POA: Diagnosis not present

## 2018-06-17 DIAGNOSIS — Z79899 Other long term (current) drug therapy: Secondary | ICD-10-CM | POA: Diagnosis not present

## 2018-06-17 DIAGNOSIS — Z7982 Long term (current) use of aspirin: Secondary | ICD-10-CM | POA: Insufficient documentation

## 2018-06-17 LAB — IRON AND TIBC
Iron: 52 ug/dL (ref 45–182)
Saturation Ratios: 20 % (ref 17.9–39.5)
TIBC: 267 ug/dL (ref 250–450)
UIBC: 215 ug/dL

## 2018-06-17 LAB — FERRITIN: Ferritin: 127 ng/mL (ref 24–336)

## 2018-06-17 LAB — CBC WITH DIFFERENTIAL/PLATELET
Abs Immature Granulocytes: 0.03 10*3/uL (ref 0.00–0.07)
BASOS PCT: 0 %
Basophils Absolute: 0 10*3/uL (ref 0.0–0.1)
EOS ABS: 0.2 10*3/uL (ref 0.0–0.5)
EOS PCT: 2 %
HEMATOCRIT: 48.5 % (ref 39.0–52.0)
Hemoglobin: 15.4 g/dL (ref 13.0–17.0)
IMMATURE GRANULOCYTES: 0 %
Lymphocytes Relative: 23 %
Lymphs Abs: 1.9 10*3/uL (ref 0.7–4.0)
MCH: 31.8 pg (ref 26.0–34.0)
MCHC: 31.8 g/dL (ref 30.0–36.0)
MCV: 100.2 fL — AB (ref 80.0–100.0)
MONO ABS: 0.6 10*3/uL (ref 0.1–1.0)
MONOS PCT: 7 %
NEUTROS PCT: 68 %
Neutro Abs: 5.6 10*3/uL (ref 1.7–7.7)
PLATELETS: 264 10*3/uL (ref 150–400)
RBC: 4.84 MIL/uL (ref 4.22–5.81)
RDW: 13.3 % (ref 11.5–15.5)
WBC: 8.3 10*3/uL (ref 4.0–10.5)
nRBC: 0 % (ref 0.0–0.2)

## 2018-06-17 MED ORDER — FERROUS SULFATE 325 (65 FE) MG PO TBEC: 325.0000 mg | DELAYED_RELEASE_TABLET | Freq: Two times a day (BID) | ORAL | 3 refills | Status: DC

## 2018-06-17 NOTE — Progress Notes (Signed)
Patient here for follow up

## 2018-06-18 NOTE — Progress Notes (Signed)
Hematology/Oncology Follow up note [facility] Telephone:([phone] [phone]   Patient Care Team: Remi Haggard, FNP as PCP - General (Family Medicine)  REFERRING PROVIDER: Remi Haggard, FNP CHIEF COMPLAINTS/Reason of Visit:  Follow up of iron deficiency anemia.   HISTORY OF PRESENTING ILLNESS:  Nicholas Hodges is a  77 y.o.  male with PMH listed below who was referred to me for evaluation of iron deficiency anemia.  Patient had lab work up recently during his ER visit for seizure vs TIA symptoms,family reports patient has had stroked work up recently, in ED, was found that Echo and carotid doppler were not done. Of note, patient and family members did not want inpatient work up so her was discharged to follow up with PCP.  CT head 10/14/2017 no acute changes.  In ED,  Lab on 10/14/2017 showed mild leukopenia with wbc 3.5, hemoglobin 9.4,decreased from his baseline of 11.8 which was a month ago. Iron panel showed TIBC 304, ferritin 14, % 13. B12 was 403.  Denies any blood in stool or black tarry stool.  Patient denies having fatigue. Family reports he has good appetite. Never had any colonoscopy. He had dementia. Lives with wife, daughter and son in law.   INTERVAL HISTORY Nicholas Hodges is a 77 y.o. male who has above history reviewed by me today presents for follow up visit for management of iron deficiency anemia.  Previously received IV venofer.  Declined endoscopy.  Reports feeling well. Fatigue is mild. Appetite is good.  Had another seizure episode during interval.   Review of Systems  Constitutional: Negative for chills, fever, malaise/fatigue and weight loss.  HENT: Negative for congestion, ear discharge, ear pain, hearing loss, nosebleeds, sinus pain and sore throat.   Eyes: Negative for double vision, photophobia, pain, discharge and redness.  Respiratory: Negative for cough, hemoptysis, sputum production, shortness of breath and wheezing.    Cardiovascular: Negative for chest pain, palpitations, orthopnea, claudication and leg swelling.  Gastrointestinal: Negative for abdominal pain, blood in stool, heartburn, melena, nausea and vomiting.  Genitourinary: Negative for dysuria, flank pain, frequency, hematuria and urgency.  Musculoskeletal: Negative for back pain, joint pain, myalgias and neck pain.  Skin: Negative for itching and rash.  Neurological: Positive for seizures. Negative for dizziness, tingling, tremors, sensory change, focal weakness, weakness and headaches.  Endo/Heme/Allergies: Negative for environmental allergies. Does not bruise/bleed easily.  Psychiatric/Behavioral: Positive for memory loss. Negative for depression, substance abuse and suicidal ideas.    MEDICAL HISTORY:  Past Medical History:  Diagnosis Date  . Anemia   . Arthritis    "knees" (08/17/2017)  . Brain tumor (benign) ([place])   . Dementia ([place])   . Epilepsy ([place])   . GERD (gastroesophageal reflux disease)   . Headache    "w/seizures and probably a couple monthly without seizures" (08/17/2017)  . Hyperlipidemia   . Hypertension   . 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 Mitral valve disorder    "it leaks" (08/17/2017)  . Myocarditis ([place])   . S/P VP shunt   . Skull fracture ([place])   . Subacute bacterial endocarditis     SURGICAL HISTORY: Past Surgical History:  Procedure Laterality Date  . BRAIN TUMOR EXCISION  1956  . BUNIONECTOMY Right   . CARDIAC CATHETERIZATION    . 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 VENTRICULOPERITONEAL SHUNT  1956    SOCIAL HISTORY: Social History   Socioeconomic History  . Marital status: Married    Spouse name: Not on file  . Number of children: Not on file  . Years of education: Not on file  . Highest education level: Not on file  Occupational History  . Not on file  Social Needs  . Financial resource strain: Not on file  . Food  insecurity:    Worry: Not on file    Inability: Not on file  . Transportation needs:    Medical: Not on file    Non-medical: Not on file  Tobacco Use  . Smoking status: Never Smoker  . Smokeless tobacco: Never Used  Substance and Sexual Activity  . Alcohol use: No  . Drug use: No  . Sexual activity: Not Currently  Lifestyle  . Physical activity:    Days per week: Not on file    Minutes per session: Not on file  . Stress: Not on file  Relationships  . Social connections:    Talks on phone: Not on file    Gets together: Not on file    Attends religious service: Not on file    Active member of club or organization: Not on file    Attends meetings of clubs or organizations: Not on file    Relationship status: Not on file  . Intimate partner violence:    Fear of current or ex partner: Not on file    Emotionally abused: Not on file    Physically abused: Not on file    Forced sexual activity: Not on file  Other Topics Concern  . Not on file  Social History Narrative   Pt and wife live with daughter and her family (spouse and 2 adult children) in 2 story home   Highest level of education; 8th grade   Retired Administrator, sports for [employer]   Has 2 children    FAMILY HISTORY: Family History  Problem Relation Age of Onset  . Aneurysm Mother   . Dementia Sister   . Stomach cancer Sister   . Colon cancer Sister     ALLERGIES:  has No Known Allergies.  MEDICATIONS:  Current Outpatient Medications  Medication Sig Dispense Refill  . aspirin EC 81 MG tablet Take 81 mg by mouth daily.    . Calcium Lactate 648 MG TABS Take 648 mg by mouth at bedtime.     . ferrous sulfate 325 (65 FE) MG EC tablet Take 1 tablet (325 mg total) by mouth 2 (two) times daily with a meal. 60 tablet 3  . ibuprofen (ADVIL,MOTRIN) 800 MG tablet Take 800 mg by mouth every 8 (eight) hours as needed (pain).     Marland Kitchen lamoTRIgine (LAMICTAL) 100 MG tablet Take 1/2 tablet twice a day (take with Lamotrigine  200mg  1tab twice a day for a total of 250mg  twice a day) 90 tablet 3  . lamoTRIgine (LAMICTAL) 200 MG tablet Take 1 tablet twice a day (take with Lamotrigine 100mg  1/2 tab twice a day for a total of 250mg  twice a day) 180 tablet 3  . Melatonin 10 MG TABS Take 10 mg by mouth at bedtime.    Marland Kitchen omeprazole (PRILOSEC) 20 MG capsule Take 20 mg by mouth daily.     . Riboflavin (B-2) 100 MG TABS Take 100 mg by mouth daily.     . nitrofurantoin, macrocrystal-monohydrate, (MACROBID) 100 MG capsule Take 1 capsule (100 mg total) by mouth 2 (two) times daily. (  Patient not taking: Reported on 06/17/2018) 14 capsule 0  . vitamin B-12 (CYANOCOBALAMIN) 100 MCG tablet Take 100 mcg by mouth daily.  99   No current facility-administered medications for this visit.      PHYSICAL EXAMINATION: ECOG PERFORMANCE STATUS: 2 - Symptomatic, <50% confined to bed Vitals:   06/17/18 1415  BP: 130/78  Pulse: 89  Resp: 18  Temp: (!) 97.1 F (36.2 C)   Filed Weights   06/17/18 1415  Weight: 113 lb 9.6 oz (51.5 kg)    Physical Exam  Constitutional: No distress.  Sitting in wheel chair  HENT:  Head: Normocephalic and atraumatic.  Mouth/Throat: No oropharyngeal exudate.  Eyes: Pupils are equal, round, and reactive to light. EOM are normal. No scleral icterus.  Neck: Normal range of motion. Neck supple. No tracheal deviation present.  Cardiovascular: Normal rate, regular rhythm and normal heart sounds. Exam reveals no gallop.  No murmur heard. Pulmonary/Chest: Effort normal and breath sounds normal. No respiratory distress. He has no wheezes.  Abdominal: Soft. Bowel sounds are normal. He exhibits no distension. There is no tenderness.  Musculoskeletal: Normal range of motion. He exhibits no edema or deformity.  Neurological: He is alert.  Skin: Skin is warm and dry. No erythema.  Psychiatric: Affect normal.  Demented.      LABORATORY DATA:  I have reviewed the data as listed Lab Results  Component Value  Date   WBC 8.3 06/17/2018   HGB 15.4 06/17/2018   HCT 48.5 06/17/2018   MCV 100.2 (H) 06/17/2018   PLT 264 06/17/2018   Recent Labs    08/17/17 0425 10/14/17 1444 10/14/17 1516 12/01/17 1047  NA 140 140 137 136  K 3.8 5.1 7.1* 4.4  CL 112* 110 108 100*  CO2 22 20*  --  26  GLUCOSE 86 79 79 98  BUN 9 13 18 16   CREATININE 0.78 0.80 0.70 0.92  CALCIUM 7.9* 8.0*  --  9.4  GFRNONAA >60 >60  --  >60  GFRAA >60 >60  --  >60  PROT 4.8* 5.3*  --  7.3  ALBUMIN 2.4* 3.2*  --  4.0  AST 30 40  --  25  ALT 11* 17  --  17  ALKPHOS 86 86  --  91  BILITOT 0.5 1.3*  --  0.4    Urine dipstick negative for hgb   ASSESSMENT & PLAN:  1. Iron deficiency anemia, unspecified iron deficiency anemia type   labs are reviewed and discussed with patient and his family memebers.  Stable hemoglobin and iron panel.  Continue oral iron supplementation.    All questions were answered. The patient knows to call the clinic with any problems questions or concerns.  Return of visit: 6 months.   Total face to face encounter time for this patient visit was 15 min. >50% of the time was  spent in counseling and coordination of care.  Earlie Server, MD, PhD Hematology Oncology [facility] at [facility] Pager- [phone] 06/18/2018

## 2018-08-15 ENCOUNTER — Encounter: Payer: Self-pay | Admitting: Neurology

## 2018-08-15 ENCOUNTER — Other Ambulatory Visit: Payer: Self-pay

## 2018-08-15 ENCOUNTER — Ambulatory Visit (INDEPENDENT_AMBULATORY_CARE_PROVIDER_SITE_OTHER): Admitting: Neurology

## 2018-08-15 VITALS — BP 96/50 | HR 94 | Ht 63.0 in | Wt 115.0 lb

## 2018-08-15 DIAGNOSIS — G40211 Localization-related (focal) (partial) symptomatic epilepsy and epileptic syndromes with complex partial seizures, intractable, with status epilepticus: Secondary | ICD-10-CM | POA: Diagnosis not present

## 2018-08-15 MED ORDER — LAMOTRIGINE 200 MG PO TABS: ORAL_TABLET | ORAL | 3 refills | Status: DC

## 2018-08-15 MED ORDER — LAMOTRIGINE 100 MG PO TABS: ORAL_TABLET | ORAL | 3 refills | Status: DC

## 2018-08-15 NOTE — Patient Instructions (Signed)
Great seeing you! Continue Lamotrigine 250mg  twice a day. Follow-up in 6 months, call for any changes  Seizure Precautions: 1. If medication has been prescribed for you to prevent seizures, take it exactly as directed.  Do not stop taking the medicine without talking to your doctor first, even if you have not had a seizure in a long time.   2. Avoid activities in which a seizure would cause danger to yourself or to others.  Don't operate dangerous machinery, swim alone, or climb in high or dangerous places, such as on ladders, roofs, or girders.  Do not drive unless your doctor says you may.  3. If you have any warning that you may have a seizure, lay down in a safe place where you can't hurt yourself.    4.  No driving for 6 months from last seizure, as per [facility].   Please refer to the following link on the [place] website for more information: http://[email or web address]/[email or web address]/drivingu.cfm   5.  Maintain good sleep hygiene. Avoid alcohol.  6.  Contact your doctor if you have any problems that may be related to the medicine you are taking.  7.  Call 911 and bring the patient back to the ED if:        A.  The seizure lasts longer than 5 minutes.       B.  The patient doesn't awaken shortly after the seizure  C.  The patient has new problems such as difficulty seeing, speaking or moving  D.  The patient was injured during the seizure  E.  The patient has a temperature over 102 F (39C)  F.  The patient vomited and now is having trouble breathing

## 2018-08-15 NOTE — Progress Notes (Signed)
NEUROLOGY FOLLOW UP OFFICE NOTE  Nicholas Hodges [id number] February 13, 1941  HISTORY OF PRESENT ILLNESS: I had the pleasure of seeing Nicholas Hodges in follow-up in the neurology clinic on 08/15/2018.  The patient was last seen 7 months ago for seizures. He is again accompanied by his daughter and son-in-law who help supplement the history today. He has overall been doing well since his last visit, he only had one seizure in 7 months, which occurred while they were in a family reunion last October 2019. He had a good trip to [place] for the holidays with no issues. He is on Lamotrigine 250mg  BID (takes 200mg  tab BID with additional 100mg  1/2 tab BID), family manages his medications. He is very pleasant and interactive in the office today. Family denies any physical or cognitive changes, he has overall been stable. His wife passed away and he asks where she is. He knows if something is moved from his room and got upset one time. He saw suitcases in his room and would think he is in a hospital or rest home, but recognizes family. He needs assistance with dressing and bathing. Appetite and sleep are good. He denies any headaches, dizziness, focal numbness/tingling/weakness, no falls.  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 He is total care at baseline, family helps him with dressing and bathing, and started noticing that he would be leaning forward and was more wobbly recently. His daughter bought him new shoes, and when she tried to put it on last 8/26, noticed that he was not reacting when his feet were touched, he would usually be very ticklish. His son-in-law put him in the tub for a bath, and he reported that he could not feel patchy areas on  his left leg. Family also noticed his speech was slurred and he was more disoriented (could not remember her name). He was brought to the ER, and daughter reports by that time, he was back to baseline, not wanting staff to touch his feet. Family stays with him 24/7 and had not witnessed any seizure activity. In the ER, he was noted to have a supratherapeutic Dilantin level of 32. Records in the ER indicate he was on 300mg  TID, but his wife and daughter today confirm repeatedly that he has been on 100mg  TID Dilantin for many years with no change in dose, no new medications added. His wife gives his medications. CBC, BMP were unremarkable except for mildly low sodium of 132. Urinalysis negative. I personally reviewed head CT without contrast which did not show any acute changes, there was severe atrophy with lateral ventriculomegaly, right frontal approach ventriculostomy catheter with tip in the temporal horn of the right lateral ventricle. 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 Family was instructed to hold Dilantin until Neurology follow-up.  His family reports he has been doing well since hospital discharge, with no further slurred speech. There is some delay in answering questions, but he appears to be able to answer appropriately when asked about dizziness ("some, not all the time"). His vision is "sometimes blurry, sometimes clear," and states it is blurry today. He denies any neck/back pain, no paresthesias. His wife reports that he continues to  have seizures every 2 months or so, he would start staring, get stiff with twitching, with head turn to the left. Seizures last 1-2 minutes, he is drowsy after. He wears adult diapers but family reports he has incontinence with the seizures. This year he has had around 4 seizures. His wife denies any other prior AEDs. They started noticing memory changes for the past several years, he would  forget little things, or put shoes on the wrong foot. Family now puts on his clothes and bathes him. He usually repeats himself. They report sleep and appetite are good. His older sister had dementia.   Update 05/14/2017: Over the past 1.5 weeks, he had a significant change in mental status. They saw his PCP yesterday who felt that he may be overmedicated due to Dilantin and Lamotrigine combination. Over the past 1.5 weeks, his family reports that he just wants to lay in bed and does not want to do anything anymore. He used to be able to feed himself, but now his wife feeds him. He has to be carried to the commode. They have noticed a decrease in his urine output, previously they had to change his sheets a lot, but now the adult diapers are less wet. They have not noticed any change in urine color or smell. His BP has been running low. No fever per family. He also states he is dizzy when family tries to stand or sit him up. They have not witnessed any of his typical seizures with body stiffening. He has been hallucinating, speaking very loudly looking at the ceiling or asking what is on the floor. He sees a little boy in a white hat or that someone bit his head. He told his wife he was dying. He has not been complaining of any headaches, no nausea/vomiting. When he eats, appetite is good but he has more difficulties swallowing. He is noted to have some right leg jerking in the office, his daughter reports he has always done that.   Update 07/05/2017: Family was given instructions to start Lamotrigine and start tapering down Dilantin. His daughter had confirmed during his visit on 05/14/17 that he was taking Lamictal 2 tabs twice a day and Dilantin 1 cap daily. On that visit, family reported a significant change in mental status, he was unable to feed himself and needed more assistance with transfers. Family denied any seizures, but reported hallucinations. He had an EEG done in the office which did not show any  electrographic seizure, there was moderate diffuse background slowing and frequent right mid-temporal epileptiform discharges. He was sent to the ER for evaluation, head CT without contrast did not show any acute changes, there were post-surgical changes from the occipital and right temporal craniotomies with areas of resection and encephalomalacia at the anterior right temporal lobe and at the posterior fossa. His Dilantin level was supratherapeutic at 46.2. I spoke to his daughter again and she then reported that they were giving the medications with instructions in reverse, Dilantin 2 caps BID and Lamictal 1 tab daily, thus causing the significant increase in Dilantin level. Dilantin was held in the ER, I instructed her to continue on discontinuing the Dilantin and start uptitrating the Lamictal. He is now on Lamictal 100mg  BID without side effects. He is much improved today, interactive, able to follow commands. His family reports that he is feeding himself and has gained weight. Since his last visit, he has had one witnessed convulsion with head deviation to the left lasting 5  minutes. It took him 30-45 minutes to recover. He occasional gets dizzy with transfers, no nausea/vomiting.   Update 08/25/2017:  He was admitted to [facility] last 08/16/17 after a prolonged seizure when his wife was awoken by repeated swallowing followed by a 20-minute GTC followed by right gaze deviation and left-sided weakness. His EEG showed right-sided slowing and right temporal sharp waves. MRI brain no changes from prior MRI with right temporal encephalomalacia, shunt in place with chronic ventriculomegaly. He was close to baseline the next day. Lamictal level was 7.8. Family was instructed to increase Lamotrigine to 200mg  BID. Aside from the prolonged seizure in January, he had one small seizure in November that lasted 5 minutes. They report that he was congested while in the hospital.    PAST MEDICAL HISTORY: Past Medical History:   Diagnosis Date  . Anemia   . Arthritis    "knees" (08/17/2017)  . Brain tumor (benign) ([place])   . Dementia ([place])   . Epilepsy ([place])   . GERD (gastroesophageal reflux disease)   . Headache    "w/seizures and probably a couple monthly without seizures" (08/17/2017)  . Hyperlipidemia   . Hypertension   . Melanoma of nose ([place])   . Mitral valve disorder    "it leaks" (08/17/2017)  . Myocarditis ([place])   . S/P VP shunt   . Skull fracture ([place])   . Subacute bacterial endocarditis     MEDICATIONS:  Outpatient Encounter Medications as of 08/15/2018  Medication Sig  . aspirin EC 81 MG tablet Take 81 mg by mouth daily.  . Calcium Lactate 648 MG TABS Take 648 mg by mouth at bedtime.   . ferrous sulfate 325 (65 FE) MG EC tablet Take 1 tablet (325 mg total) by mouth 2 (two) times daily with a meal.  . ibuprofen (ADVIL,MOTRIN) 800 MG tablet Take 800 mg by mouth every 8 (eight) hours as needed (pain).   Marland Kitchen lamoTRIgine (LAMICTAL) 100 MG tablet Take 1/2 tablet twice a day (take with Lamotrigine 200mg  1tab twice a day for a total of 250mg  twice a day)  . lamoTRIgine (LAMICTAL) 200 MG tablet Take 1 tablet twice a day (take with Lamotrigine 100mg  1/2 tab twice a day for a total of 250mg  twice a day)  . Melatonin 10 MG TABS Take 10 mg by mouth at bedtime.  . nitrofurantoin, macrocrystal-monohydrate, (MACROBID) 100 MG capsule Take 1 capsule (100 mg total) by mouth 2 (two) times daily. (Patient not taking: Reported on 06/17/2018)  . omeprazole (PRILOSEC) 20 MG capsule Take 20 mg by mouth daily.   . Riboflavin (B-2) 100 MG TABS Take 100 mg by mouth daily.   . vitamin B-12 (CYANOCOBALAMIN) 100 MCG tablet Take 100 mcg by mouth daily.   No facility-administered encounter medications on file as of 08/15/2018.      ALLERGIES: No Known Allergies  FAMILY HISTORY: Family History  Problem Relation Age of Onset  . Aneurysm Mother   . Dementia Sister   . Stomach cancer Sister   . Colon cancer Sister     SOCIAL  HISTORY: Social History   Socioeconomic History  . Marital status: Married    Spouse name: Not on file  . Number of children: Not on file  . Years of education: Not on file  . Highest education level: Not on file  Occupational History  . Not on file  Social Needs  . Financial resource strain: Not on file  . Food insecurity:    Worry: Not on  file    Inability: Not on file  . Transportation needs:    Medical: Not on file    Non-medical: Not on file  Tobacco Use  . Smoking status: Never Smoker  . Smokeless tobacco: Never Used  Substance and Sexual Activity  . Alcohol use: No  . Drug use: No  . Sexual activity: Not Currently  Lifestyle  . Physical activity:    Days per week: Not on file    Minutes per session: Not on file  . Stress: Not on file  Relationships  . Social connections:    Talks on phone: Not on file    Gets together: Not on file    Attends religious service: Not on file    Active member of club or organization: Not on file    Attends meetings of clubs or organizations: Not on file    Relationship status: Not on file  . Intimate partner violence:    Fear of current or ex partner: Not on file    Emotionally abused: Not on file    Physically abused: Not on file    Forced sexual activity: Not on file  Other Topics Concern  . Not on file  Social History Narrative   Pt and wife live with daughter and her family (spouse and 2 adult children) in 2 story home   Highest level of education; 8th grade   Retired Administrator, sports for [employer]   Has 2 children    REVIEW OF SYSTEMS:  Constitutional: No fevers, chills, or sweats, no generalized fatigue, change in appetite Eyes: No visual changes, double vision, eye pain Ear, nose and throat: No hearing loss, ear pain, nasal congestion, sore throat Cardiovascular: No chest pain, palpitations Respiratory:  No shortness of breath at rest or with exertion, wheezes GastrointestinaI: No nausea, vomiting, diarrhea,  abdominal pain, fecal incontinence Genitourinary:  No dysuria, urinary retention or frequency Musculoskeletal:  No neck pain, back pain Integumentary: No rash, pruritus, skin lesions Neurological: as above Psychiatric: No depression, insomnia, anxiety Endocrine: No palpitations, fatigue, diaphoresis, mood swings, change in appetite, change in weight, increased thirst Hematologic/Lymphatic:  No anemia, purpura, petechiae. Allergic/Immunologic: no itchy/runny eyes, nasal congestion, recent allergic reactions, rashes  PHYSICAL EXAM: Vitals:   08/15/18 1619  BP: (!) 96/50  Pulse: 94  SpO2: 97%   General: No acute distress, sitting on wheelchair, interactive, smiling, following simple commands (similar to prior) Head: Normocephalic/atraumatic Neck: supple, no paraspinal tenderness, full range of motion Heart:  Regular rate and rhythm Lungs:  Clear to auscultation bilaterally Back: No paraspinal tenderness Skin/Extremities: No rash, no edema Neurological Exam: alert and oriented to person, place. He is able to follow simple commands. Able to name, repeat. 0/3 delayed recall. Cranial nerves: Pupils equal, round, reactive to light. Dysconjugate gaze with extraocular movements intact, no nystagmus. Visual fields full. No facial asymmetry, edentulous. Tongue, uvula, palate midline.  Motor: Bulk and tone normal, no cogwheeling, moves all extremities symmetrically. Sensation intact to light touch. No incoordination on finger to nose testing. Gait not tested (wheelchair-bound).  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 His wife continued to report seizures every few months while on the Dilantin. He is now off Dilantin and has been on Lamotrigine monotherapy at 250mg  BID with  one seizure in the past  7 months, continue current dose, refills sent. Continue 24/7 care. He will follow-up in 6 months, family knows to call for any changes.   Thank you for allowing me to participate in his care.  Please do not hesitate to call for any questions or concerns.  The duration of this appointment visit was 20 minutes of face-to-face time with the patient.  Greater than 50% of this time was spent in counseling, explanation of diagnosis, planning of further management, and coordination of care.   Ellouise Newer, M.D.   CC: Threasa Alpha, FNP

## 2018-08-16 ENCOUNTER — Encounter: Payer: Self-pay | Admitting: Neurology

## 2018-09-26 ENCOUNTER — Other Ambulatory Visit: Payer: Self-pay | Admitting: *Deleted

## 2018-09-26 MED ORDER — FERROUS SULFATE 325 (65 FE) MG PO TBEC: 325.0000 mg | DELAYED_RELEASE_TABLET | Freq: Two times a day (BID) | ORAL | 3 refills | Status: DC

## 2018-10-05 IMAGING — CT CT HEAD W/O CM
4 series · 16 of 47 positions shown, 18 images · non-contrast
Comparison: 08/16/2017

CLINICAL DATA: TIA, altered consciousness.  Shunt.

EXAM:
CT HEAD WITHOUT CONTRAST
TECHNIQUE: Contiguous axial images were obtained from the base of the skull
through the vertex without intravenous contrast.

[Series 3: head wo · axial · 0.43mm/px · z∈[-80,+46]mm · 7 of 35 slices shown, 9 images]
[im 5/35  brain]
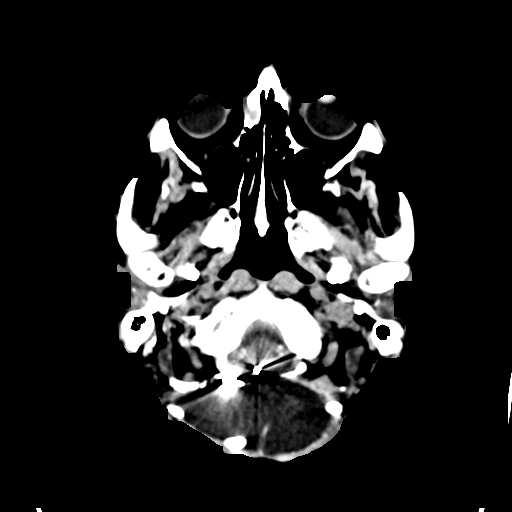
[im 5/35  bone]
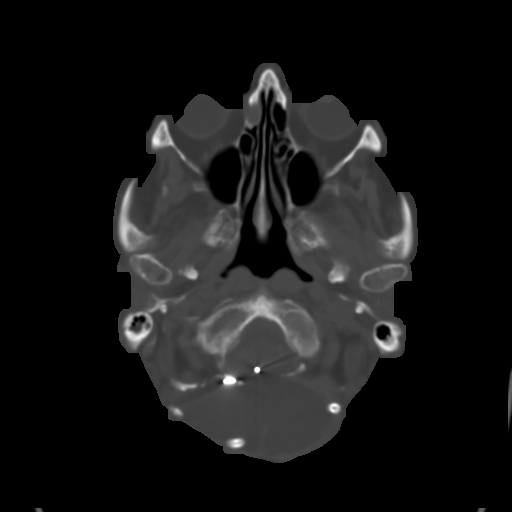
[im 9/35  brain]
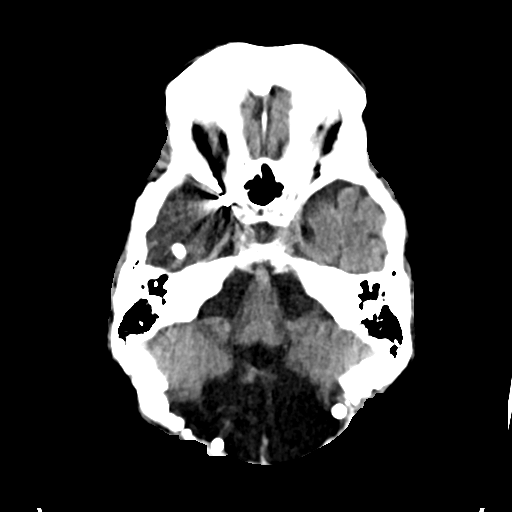
[im 13/35  brain]
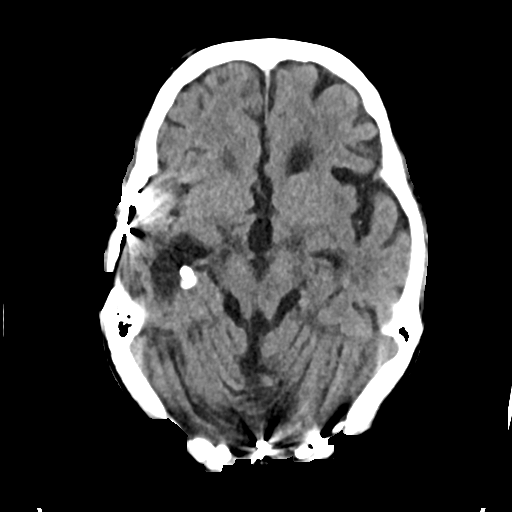
[im 18/35  brain]
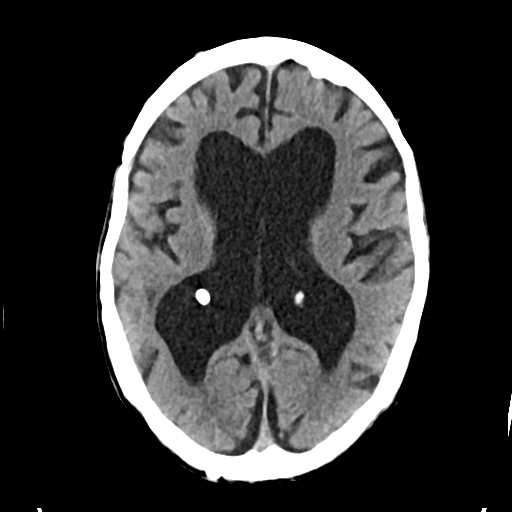
[im 22/35  brain]
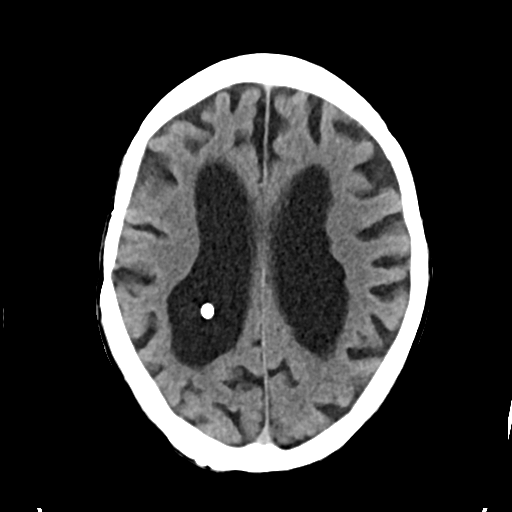
[im 22/35  bone]
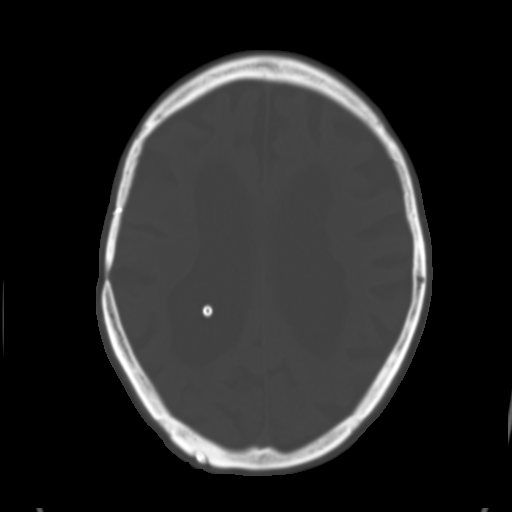
[im 26/35  brain]
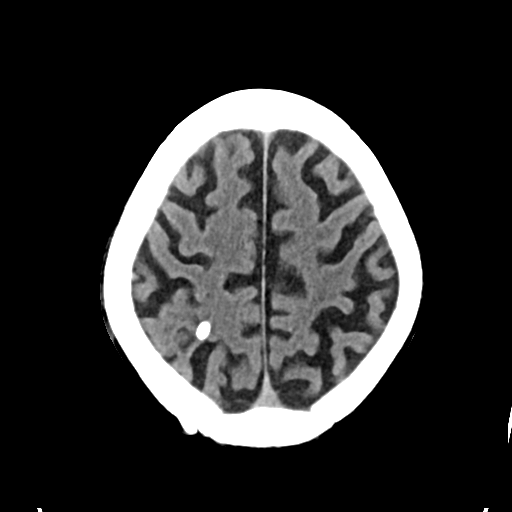
[im 30/35  brain]
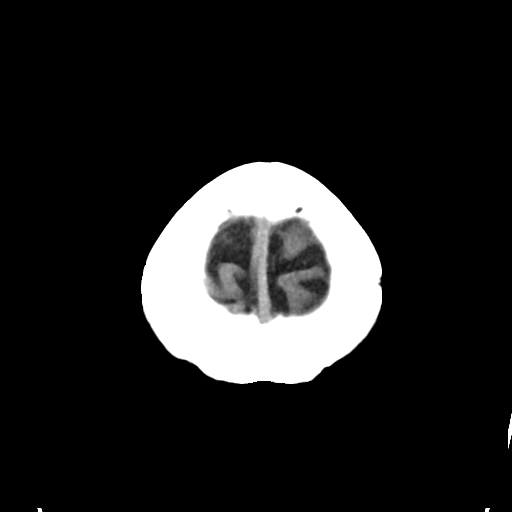

[Series 4: head bone · axial · 0.43mm/px · z∈[-84,-50]mm · 3 of 86 slices shown]
[im 9/86  bone]
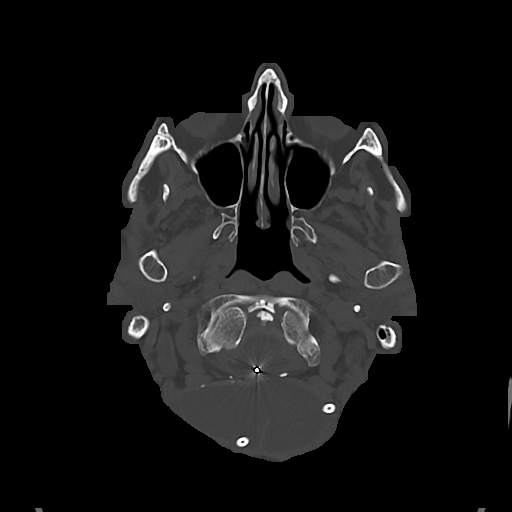
[im 18/86  bone]
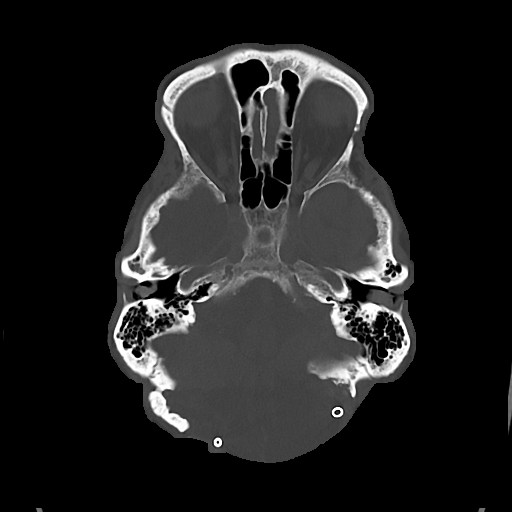
[im 26/86  bone]
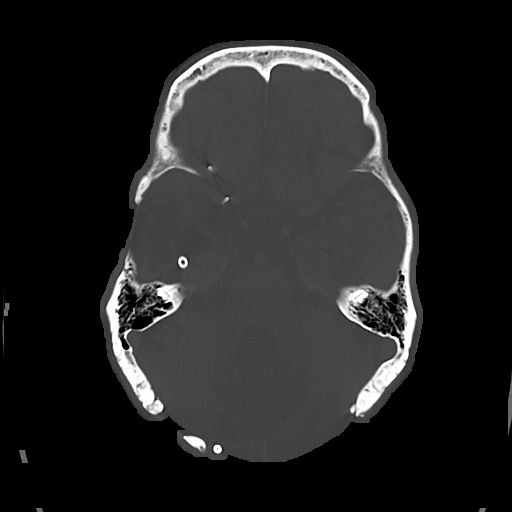

[Series 5: cor soft · coronal · 0.33mm/px · 3 of 72 slices shown]
[im 24/72  brain]
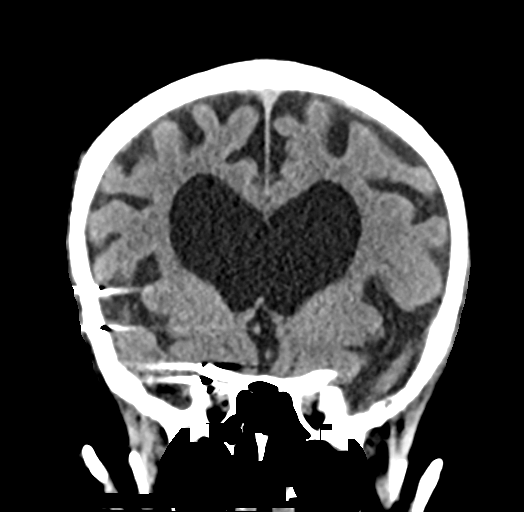
[im 32/72  brain]
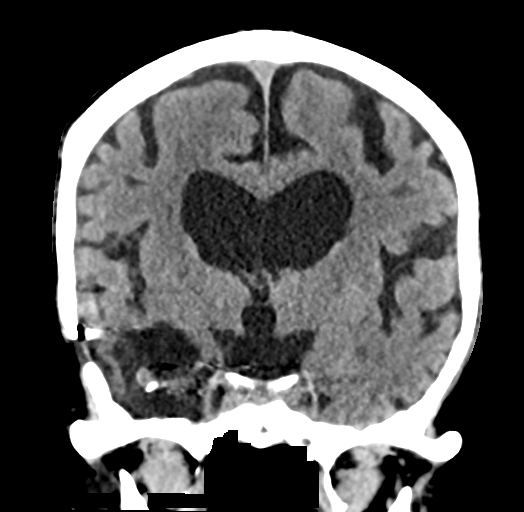
[im 40/72  brain]
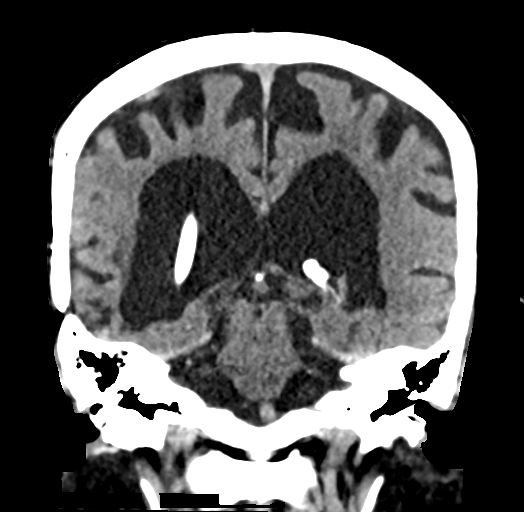

[Series 6: sag soft · sagittal · 0.33mm/px · 3 of 66 slices shown]
[im 22/66  brain]
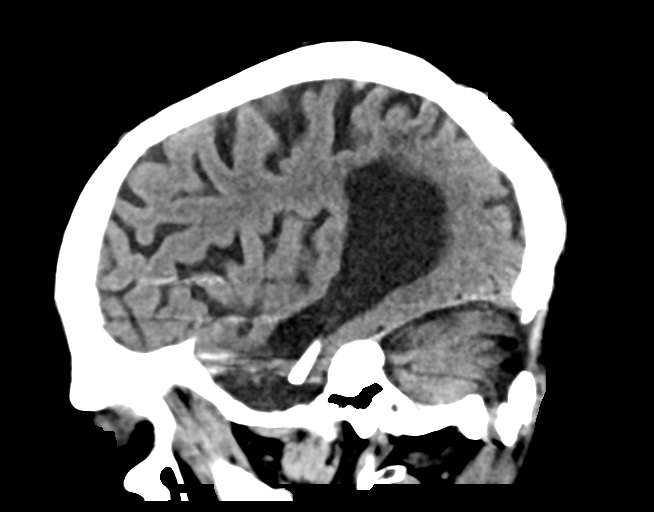
[im 33/66  brain]
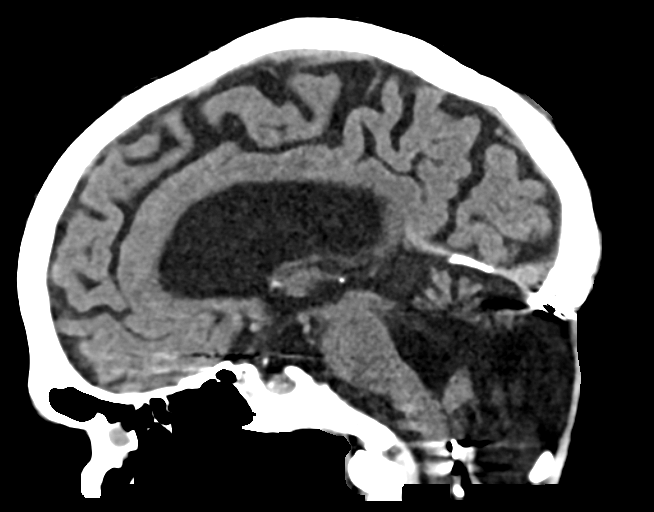
[im 44/66  brain]
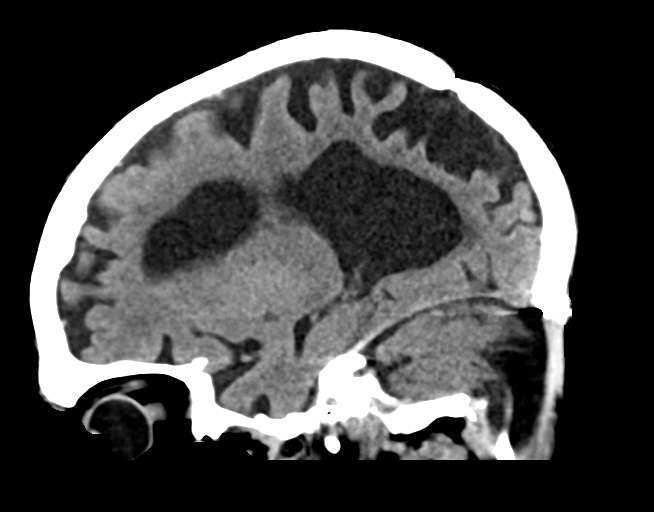

[16 of 47 positions shown; findings below may reference images not displayed]

FINDINGS: Brain: Advanced atrophy. Marked ventricular enlargement stable.
Ventricular shunt on the right extends into the right temporal horn
unchanged in position.

Postop craniotomy on the right. Suboccipital craniectomy with large
posterior fluid collection unchanged compatible with
pseudomeningocele. Encephalomalacia in the cerebellum bilaterally
with diffuse cerebellar atrophy.

Negative for acute infarct or hemorrhage.  Negative for mass lesion.

Vascular: Negative for hyperdense vessel

Skull: Suboccipital craniectomy unchanged. Right temporal craniotomy

Sinuses/Orbits: Right cataract removal. No orbital mass. Paranasal
sinuses clear.

Other: None
IMPRESSION: No change from the prior study. Generalized atrophy. Marked
ventricular enlargement stable. Right ventricular catheter unchanged
in position.

## 2018-12-13 ENCOUNTER — Telehealth: Payer: Self-pay | Admitting: *Deleted

## 2018-12-13 NOTE — Telephone Encounter (Signed)
Patient daughter Liliana Cline) stated that they wanted to [facility] 12/16/18 lab/MD appts and that she would call back at a later date to R/S.

## 2018-12-16 ENCOUNTER — Inpatient Hospital Stay: Admitting: Oncology

## 2018-12-16 ENCOUNTER — Inpatient Hospital Stay

## 2019-03-14 ENCOUNTER — Ambulatory Visit: Payer: Medicare Other | Admitting: Neurology

## 2019-08-27 ENCOUNTER — Other Ambulatory Visit: Payer: Self-pay | Admitting: Neurology

## 2019-08-31 ENCOUNTER — Other Ambulatory Visit: Payer: Self-pay | Admitting: Neurology

## 2019-08-31 ENCOUNTER — Other Ambulatory Visit: Payer: Self-pay

## 2019-08-31 MED ORDER — LAMOTRIGINE 200 MG PO TABS: ORAL_TABLET | ORAL | 3 refills | Status: DC

## 2019-08-31 MED ORDER — LAMOTRIGINE 100 MG PO TABS: ORAL_TABLET | ORAL | 3 refills | Status: DC

## 2019-08-31 NOTE — Telephone Encounter (Signed)
Spoke with daughter. Dr. Delice Lesch sent in both Lamictal prescriptions on 08/28/19. The pharmacy said they did not get the refills and told the pt to call our office.  Rx's resent with same instructions.

## 2019-08-31 NOTE — Telephone Encounter (Signed)
Daughter called in that patient is needing both his seizure medications -lamotrigine 100mg  and 200mg  to the pharm on file. The pharm already faxed a request but didn't get a response she said. He only has two days worth for both left. Thanks!

## 2020-11-07 ENCOUNTER — Telehealth: Payer: Self-pay | Admitting: Neurology

## 2020-11-07 MED ORDER — LAMOTRIGINE 100 MG PO TABS: ORAL_TABLET | ORAL | 1 refills | Status: DC

## 2020-11-07 MED ORDER — LAMOTRIGINE 200 MG PO TABS: ORAL_TABLET | ORAL | 1 refills | Status: DC

## 2020-11-07 NOTE — Addendum Note (Signed)
Addended by: Armen Pickup A on: 11/07/2020 04:20 PM   Modules accepted: Orders

## 2020-11-07 NOTE — Telephone Encounter (Signed)
Pls confirm how he is taking it, when I last saw him he was on Lamotrigine 200mg  1 tab BID AND Lamotrigine 100mg  1/2 tab BID. Ok to send refills for how he is taking it, until his f/u with me, thanks

## 2020-11-07 NOTE — Telephone Encounter (Signed)
Patient is out of the medication Lamotrigine medication and would like a refill sent to [facility] he has appt on 06-04-21 with Delice Lesch

## 2020-11-07 NOTE — Telephone Encounter (Signed)
Ok to send, thanks

## 2020-11-07 NOTE — Telephone Encounter (Signed)
Called and spoke to patients daughter and ask how patient has been taking medication. Patients daughter stated that patient takes one 200mg  tablet and a half tablet of the 100mg  tablet, equalling 250mg  daily. Informed patients daughter we will get that sent.

## 2021-05-26 ENCOUNTER — Other Ambulatory Visit: Payer: Self-pay | Admitting: Neurology

## 2021-05-28 ENCOUNTER — Encounter: Payer: Self-pay | Admitting: Oncology

## 2021-06-04 ENCOUNTER — Encounter: Payer: Self-pay | Admitting: Neurology

## 2021-06-04 ENCOUNTER — Other Ambulatory Visit: Payer: Self-pay

## 2021-06-04 ENCOUNTER — Ambulatory Visit (INDEPENDENT_AMBULATORY_CARE_PROVIDER_SITE_OTHER): Payer: Medicare Other | Admitting: Neurology

## 2021-06-04 VITALS — BP 151/89 | HR 64 | Wt 104.6 lb

## 2021-06-04 DIAGNOSIS — G40209 Localization-related (focal) (partial) symptomatic epilepsy and epileptic syndromes with complex partial seizures, not intractable, without status epilepticus: Secondary | ICD-10-CM | POA: Diagnosis not present

## 2021-06-04 MED ORDER — LAMOTRIGINE 200 MG PO TABS: ORAL_TABLET | ORAL | 3 refills | Status: DC

## 2021-06-04 NOTE — Patient Instructions (Signed)
Good to see you! Continue Lamotrigine 200mg : take 1 tablet every morning. Follow-up in 1 year, call for any changes.   Seizure Precautions: 1. If medication has been prescribed for you to prevent seizures, take it exactly as directed.  Do not stop taking the medicine without talking to your doctor first, even if you have not had a seizure in a long time.   2. Avoid activities in which a seizure would cause danger to yourself or to others.  Don't operate dangerous machinery, swim alone, or climb in high or dangerous places, such as on ladders, roofs, or girders.  Do not drive unless your doctor says you may.  3. If you have any warning that you may have a seizure, lay down in a safe place where you can't hurt yourself.    4.  No driving for 6 months from last seizure, as per [facility].   Please refer to the following link on the [place] website for more information: http://[email or web address]/[email or web address]/driving/drivingu.cfm   5.  Maintain good sleep hygiene.   6.  Contact your doctor if you have any problems that may be related to the medicine you are taking.  7.  Call 911 and bring the patient back to the ED if:        A.  The seizure lasts longer than 5 minutes.       B.  The patient doesn't awaken shortly after the seizure  C.  The patient has new problems such as difficulty seeing, speaking or moving  D.  The patient was injured during the seizure  E.  The patient has a temperature over 102 F (39C)  F.  The patient vomited and now is having trouble breathing

## 2021-06-04 NOTE — Progress Notes (Signed)
NEUROLOGY FOLLOW UP OFFICE NOTE  STONEY KARCZEWSKI [id number] 1940/08/17  HISTORY OF PRESENT ILLNESS: I had the pleasure of seeing Nicholas Hodges in follow-up in the neurology clinic on 06/04/2021.  The patient was last seen over 2 years ago for seizures. He is again accompanied by his son-in-law who helps supplement the history today. Since his last visit, his son-in-law notes that he has had 2 minor seizures, he had one in 05/2020, then a "little one" 6-8 months ago with shaking and staring that lasted 2 minutes. He was in bed, no injuries. On his last visit in 2020, he was on Lamotrigine 250mg  BID. His son-in-law reports that they have been giving him 200mg  every morning for a long time now with no increase in seizures. Marc does not complain of any symptoms to them, he denies any headaches, dizziness. He reports right ear issues. He is eating well, able to feed himself. Sleep is good. He wears adult diapers. Family always holds him to ambulate.    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 He is total care at baseline, family helps him with dressing and bathing, and started noticing that he would be leaning forward and was more wobbly recently. His daughter bought him new shoes, and when she tried to put it on last 8/26, noticed that he was not reacting when his feet were touched, he would usually be very ticklish. His son-in-law put him in the tub for a bath, and he reported that he could not feel patchy areas on his left leg. Family also noticed his speech was slurred and he was more disoriented (could not remember her name). He was brought to the ER, and daughter reports by that time, he was back to baseline, not wanting staff to touch  his feet. Family stays with him 24/7 and had not witnessed any seizure activity. In the ER, he was noted to have a supratherapeutic Dilantin level of 32. Records in the ER indicate he was on 300mg  TID, but his wife and daughter today confirm repeatedly that he has been on 100mg  TID Dilantin for many years with no change in dose, no new medications added. His wife gives his medications. CBC, BMP were unremarkable except for mildly low sodium of 132. Urinalysis negative. I personally reviewed head CT without contrast which did not show any acute changes, there was severe atrophy with lateral ventriculomegaly, right frontal approach ventriculostomy catheter with tip in the temporal horn of the right lateral ventricle. 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 Family was instructed to hold Dilantin until Neurology follow-up.   His family reports he has been doing well since hospital discharge, with no further slurred speech. There is some delay in answering questions, but he appears to be able to answer appropriately when asked about dizziness ("some, not all the time"). His vision is "sometimes blurry, sometimes clear," and states it is blurry today. He denies any neck/back pain, no paresthesias. His wife reports that he continues to have seizures every 2 months or so, he would start staring, get stiff with twitching, with head turn to the left. Seizures last 1-2 minutes, he is drowsy after. He wears adult diapers but  family reports he has incontinence with the seizures. This year he has had around 4 seizures. His wife denies any other prior AEDs. They started noticing memory changes for the past several years, he would forget little things, or put shoes on the wrong foot. Family now puts on his clothes and bathes him. He usually repeats himself. They report sleep and appetite are good. His older sister had dementia.   Update 05/14/2017: Over  the past 1.5 weeks, he had a significant change in mental status. They saw his PCP yesterday who felt that he may be overmedicated due to Dilantin and Lamotrigine combination. Over the past 1.5 weeks, his family reports that he just wants to lay in bed and does not want to do anything anymore. He used to be able to feed himself, but now his wife feeds him. He has to be carried to the commode. They have noticed a decrease in his urine output, previously they had to change his sheets a lot, but now the adult diapers are less wet. They have not noticed any change in urine color or smell. His BP has been running low. No fever per family. He also states he is dizzy when family tries to stand or sit him up. They have not witnessed any of his typical seizures with body stiffening. He has been hallucinating, speaking very loudly looking at the ceiling or asking what is on the floor. He sees a little boy in a white hat or that someone bit his head. He told his wife he was dying. He has not been complaining of any headaches, no nausea/vomiting. When he eats, appetite is good but he has more difficulties swallowing. He is noted to have some right leg jerking in the office, his daughter reports he has always done that.   Update 07/05/2017: Family was given instructions to start Lamotrigine and start tapering down Dilantin. His daughter had confirmed during his visit on 05/14/17 that he was taking Lamictal 2 tabs twice a day and Dilantin 1 cap daily. On that visit, family reported a significant change in mental status, he was unable to feed himself and needed more assistance with transfers. Family denied any seizures, but reported hallucinations. He had an EEG done in the office which did not show any electrographic seizure, there was moderate diffuse background slowing and frequent right mid-temporal epileptiform discharges. He was sent to the ER for evaluation, head CT without contrast did not show any acute changes, there  were post-surgical changes from the occipital and right temporal craniotomies with areas of resection and encephalomalacia at the anterior right temporal lobe and at the posterior fossa. His Dilantin level was supratherapeutic at 46.2. I spoke to his daughter again and she then reported that they were giving the medications with instructions in reverse, Dilantin 2 caps BID and Lamictal 1 tab daily, thus causing the significant increase in Dilantin level. Dilantin was held in the ER, I instructed her to continue on discontinuing the Dilantin and start uptitrating the Lamictal. He is now on Lamictal 100mg  BID without side effects. He is much improved today, interactive, able to follow commands. His family reports that he is feeding himself and has gained weight. Since his last visit, he has had one witnessed convulsion with head deviation to the left lasting 5 minutes. It took him 30-45 minutes to recover. He occasional gets dizzy with transfers, no nausea/vomiting.    Update 08/25/2017:  He was admitted to [facility] last 08/16/17 after a prolonged seizure when  his wife was awoken by repeated swallowing followed by a 20-minute GTC followed by right gaze deviation and left-sided weakness. His EEG showed right-sided slowing and right temporal sharp waves. MRI brain no changes from prior MRI with right temporal encephalomalacia, shunt in place with chronic ventriculomegaly. He was close to baseline the next day. Lamictal level was 7.8. Family was instructed to increase Lamotrigine to 200mg  BID. Aside from the prolonged seizure in January, he had one small seizure in November that lasted 5 minutes. They report that he was congested while in the hospital.   PAST MEDICAL HISTORY: Past Medical History:  Diagnosis Date   Anemia    Arthritis    "knees" (08/17/2017)   Brain tumor (benign) ([place])    Dementia (HCC)    Epilepsy ([place])    GERD (gastroesophageal reflux disease)    Headache    "w/seizures and probably a couple  monthly without seizures" (08/17/2017)   Hyperlipidemia    Hypertension    Melanoma of nose ([place])    Mitral valve disorder    "it leaks" (08/17/2017)   Myocarditis ([place])    S/P VP shunt    Skull fracture (HCC)    Subacute bacterial endocarditis     MEDICATIONS: Current Outpatient Medications on File Prior to Visit  Medication Sig Dispense Refill   aspirin EC 81 MG tablet Take 81 mg by mouth daily.     lamoTRIgine (LAMICTAL) 200 MG tablet TAKE 1 TABLET BY MOUTH ONCE DAILY (ALONG  WITH  1/2  TAB  OF  LAMOTRIGINE  100MG   FOR  TOTAL  DOSE  OF  250MG /DAY) 90 tablet 0   No current facility-administered medications on file prior to visit.    ALLERGIES: No Known Allergies  FAMILY HISTORY: Family History  Problem Relation Age of Onset   Aneurysm Mother    Dementia Sister    Stomach cancer Sister    Colon cancer Sister     SOCIAL HISTORY: Social History   Socioeconomic History   Marital status: Widowed    Spouse name: Not on file   Number of children: Not on file   Years of education: Not on file   Highest education level: Not on file  Occupational History   Not on file  Tobacco Use   Smoking status: Never   Smokeless tobacco: Never  Vaping Use   Vaping Use: Never used  Substance and Sexual Activity   Alcohol use: No   Drug use: No   Sexual activity: Not Currently  Other Topics Concern   Not on file  Social History Narrative   Pt and wife live with daughter and her family (spouse and 2 adult children) in 2 story home   Highest level of education; 8th grade   Retired Administrator, sports for [employer]   Has 2 children   Social Determinants of Radio broadcast assistant Strain: Not on file  Food Insecurity: Not on file  Transportation Needs: Not on file  Physical Activity: Not on file  Stress: Not on file  Social Connections: Not on file  Intimate Partner Violence: Not on file     PHYSICAL EXAM: Vitals:   06/04/21 1131  BP: (!) 151/89  Pulse: 64   SpO2: 98%   General: No acute distress Head:  Normocephalic/atraumatic Skin/Extremities: No rash, no edema Neurological Exam: alert and awake. No aphasia, mild dysarthria (chronic). Fund of knowledge is reduced.  Recent and remote memory are impaired. Attention and concentration are reduced.  Cranial nerves: Pupils  equal, round. Extraocular movements intact with no nystagmus. Visual fields full.  No facial asymmetry.  Motor: Bulk and tone normal, muscle strength 5/5 throughout with no pronator drift.   Finger to nose testing intact.  Gait not tested.    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 He has had a good response to switching to Lamotrigine monotherapy, previously requiring increasing doses to 250mg  BID, however today they report that they self-reduced dose to Lamotrigine 200mg  every morning with no worsening of seizures. Continue on current dose, refills sent. Continue 24/7 care. Follow-up in 1 year, they know to call for any changes.   Thank you for allowing me to participate in his care.  Please do not hesitate to call for any questions or concerns.    Ellouise Newer, M.D.   CC: Threasa Alpha, FNP

## 2022-06-04 ENCOUNTER — Ambulatory Visit (INDEPENDENT_AMBULATORY_CARE_PROVIDER_SITE_OTHER): Payer: Medicare Other | Admitting: Neurology

## 2022-06-04 ENCOUNTER — Encounter: Payer: Self-pay | Admitting: Neurology

## 2022-06-04 VITALS — BP 132/80 | HR 71

## 2022-06-04 DIAGNOSIS — F0280 Dementia in other diseases classified elsewhere without behavioral disturbance: Secondary | ICD-10-CM | POA: Diagnosis not present

## 2022-06-04 DIAGNOSIS — G40209 Localization-related (focal) (partial) symptomatic epilepsy and epileptic syndromes with complex partial seizures, not intractable, without status epilepticus: Secondary | ICD-10-CM

## 2022-06-04 MED ORDER — LAMOTRIGINE 200 MG PO TABS: ORAL_TABLET | ORAL | 3 refills | Status: DC

## 2022-06-04 NOTE — Progress Notes (Signed)
NEUROLOGY FOLLOW UP OFFICE NOTE  Nicholas Hodges [id number] 1940-12-11  HISTORY OF PRESENT ILLNESS: I had the pleasure of seeing Brentton Wardlow in follow-up in the neurology clinic on 06/04/2022.  The patient was last seen a year ago for seizures. He is again accompanied by his daughter and son-in-law who help supplement the history today.  Records and images were personally reviewed where available.  Since his last visit, they report one seizure in the past year. He had a small seizure in March 2023 with body jerking but only lasting a few seconds. No clear triggers. He is on Lamotrigine '200mg'$  every morning, family had self-reduced dose in 2021. No side effects. He is overall doing well but he is getting more confused, thinking his daughter is his sister. Today he introduces her as his sister and Chrissie Noa as his brother-in-law. Family has noticed he gets choked when eating but still able to feed himself. No agitation. Sleep is good. He ambulates with assistance at all times.    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 He is total care at baseline, family helps him with dressing and bathing, and started noticing that he would be leaning forward and was more wobbly recently. His daughter bought him new shoes, and when she tried to put it on last 8/26, noticed that he was not reacting when his feet were touched, he would usually be very ticklish. His son-in-law put him in the tub for a bath, and he reported that he could not feel patchy areas on his left leg. Family also noticed his speech was slurred and he was more disoriented (could not remember her name). He was brought to the ER, and daughter reports by that time, he was back to baseline,  not wanting staff to touch his feet. Family stays with him 24/7 and had not witnessed any seizure activity. In the ER, he was noted to have a supratherapeutic Dilantin level of 32. Records in the ER indicate he was on '300mg'$  TID, but his wife and daughter today confirm repeatedly that he has been on '100mg'$  TID Dilantin for many years with no change in dose, no new medications added. His wife gives his medications. CBC, BMP were unremarkable except for mildly low sodium of 132. Urinalysis negative. I personally reviewed head CT without contrast which did not show any acute changes, there was severe atrophy with lateral ventriculomegaly, right frontal approach ventriculostomy catheter with tip in the temporal horn of the right lateral ventricle. 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 Family was instructed to hold Dilantin until Neurology follow-up.   His family reports he has been doing well since hospital discharge, with no further slurred speech. There is some delay in answering questions, but he appears to be able to answer appropriately when asked about dizziness ("some, not all the time"). His vision is "sometimes blurry, sometimes clear," and states it is blurry today. He denies any neck/back pain, no paresthesias. His wife reports that he continues to have seizures every 2 months or so, he would start staring, get stiff with twitching, with head turn to the left. Seizures last 1-2 minutes, he is drowsy after. He wears adult diapers but  family reports he has incontinence with the seizures. This year he has had around 4 seizures. His wife denies any other prior AEDs. They started noticing memory changes for the past several years, he would forget little things, or put shoes on the wrong foot. Family now puts on his clothes and bathes him. He usually repeats himself. They report sleep and appetite are good. His older sister had dementia.    Update 05/14/2017: Over the past 1.5 weeks, he had a significant change in mental status. They saw his PCP yesterday who felt that he may be overmedicated due to Dilantin and Lamotrigine combination. Over the past 1.5 weeks, his family reports that he just wants to lay in bed and does not want to do anything anymore. He used to be able to feed himself, but now his wife feeds him. He has to be carried to the commode. They have noticed a decrease in his urine output, previously they had to change his sheets a lot, but now the adult diapers are less wet. They have not noticed any change in urine color or smell. His BP has been running low. No fever per family. He also states he is dizzy when family tries to stand or sit him up. They have not witnessed any of his typical seizures with body stiffening. He has been hallucinating, speaking very loudly looking at the ceiling or asking what is on the floor. He sees a little boy in a white hat or that someone bit his head. He told his wife he was dying. He has not been complaining of any headaches, no nausea/vomiting. When he eats, appetite is good but he has more difficulties swallowing. He is noted to have some right leg jerking in the office, his daughter reports he has always done that.   Update 07/05/2017: Family was given instructions to start Lamotrigine and start tapering down Dilantin. His daughter had confirmed during his visit on 05/14/17 that he was taking Lamictal 2 tabs twice a day and Dilantin 1 cap daily. On that visit, family reported a significant change in mental status, he was unable to feed himself and needed more assistance with transfers. Family denied any seizures, but reported hallucinations. He had an EEG done in the office which did not show any electrographic seizure, there was moderate diffuse background slowing and frequent right mid-temporal epileptiform discharges. He was sent to the ER for evaluation, head CT without contrast did not show  any acute changes, there were post-surgical changes from the occipital and right temporal craniotomies with areas of resection and encephalomalacia at the anterior right temporal lobe and at the posterior fossa. His Dilantin level was supratherapeutic at 46.2. I spoke to his daughter again and she then reported that they were giving the medications with instructions in reverse, Dilantin 2 caps BID and Lamictal 1 tab daily, thus causing the significant increase in Dilantin level. Dilantin was held in the ER, I instructed her to continue on discontinuing the Dilantin and start uptitrating the Lamictal. He is now on Lamictal '100mg'$  BID without side effects. He is much improved today, interactive, able to follow commands. His family reports that he is feeding himself and has gained weight. Since his last visit, he has had one witnessed convulsion with head deviation to the left lasting 5 minutes. It took him 30-45 minutes to recover. He occasional gets dizzy with transfers, no nausea/vomiting.    Update 08/25/2017:  He was admitted to [facility] last 08/16/17 after a prolonged seizure when  his wife was awoken by repeated swallowing followed by a 20-minute GTC followed by right gaze deviation and left-sided weakness. His EEG showed right-sided slowing and right temporal sharp waves. MRI brain no changes from prior MRI with right temporal encephalomalacia, shunt in place with chronic ventriculomegaly. He was close to baseline the next day. Lamictal level was 7.8. Family was instructed to increase Lamotrigine to '200mg'$  BID. Aside from the prolonged seizure in January, he had one small seizure in November that lasted 5 minutes. They report that he was congested while in the hospital.    PAST MEDICAL HISTORY: Past Medical History:  Diagnosis Date   Anemia    Arthritis    "knees" (08/17/2017)   Brain tumor (benign) ([place])    Dementia (HCC)    Epilepsy ([place])    GERD (gastroesophageal reflux disease)    Headache    "w/seizures  and probably a couple monthly without seizures" (08/17/2017)   Hyperlipidemia    Hypertension    Melanoma of nose ([place])    Mitral valve disorder    "it leaks" (08/17/2017)   Myocarditis ([place])    S/P VP shunt    Skull fracture (HCC)    Subacute bacterial endocarditis     MEDICATIONS: Current Outpatient Medications on File Prior to Visit  Medication Sig Dispense Refill   aspirin EC 81 MG tablet Take 81 mg by mouth daily.     lamoTRIgine (LAMICTAL) 200 MG tablet Take 1 tablet every morning 90 tablet 3   No current facility-administered medications on file prior to visit.    ALLERGIES: No Known Allergies  FAMILY HISTORY: Family History  Problem Relation Age of Onset   Aneurysm Mother    Dementia Sister    Stomach cancer Sister    Colon cancer Sister     SOCIAL HISTORY: Social History   Socioeconomic History   Marital status: Widowed    Spouse name: Not on file   Number of children: Not on file   Years of education: Not on file   Highest education level: Not on file  Occupational History   Not on file  Tobacco Use   Smoking status: Never   Smokeless tobacco: Never  Vaping Use   Vaping Use: Never used  Substance and Sexual Activity   Alcohol use: No   Drug use: No   Sexual activity: Not Currently  Other Topics Concern   Not on file  Social History Narrative   Pt and wife live with daughter and her family (spouse and 2 adult children) in 2 story home   Highest level of education; 8th grade   Retired Administrator, sports for [employer]   Has 2 children   Social Determinants of Radio broadcast assistant Strain: Not on file  Food Insecurity: Not on file  Transportation Needs: Not on file  Physical Activity: Not on file  Stress: Not on file  Social Connections: Not on file  Intimate Partner Violence: Not on file     PHYSICAL EXAM: Vitals:   06/04/22 1128  BP: 132/80  Pulse: 71  SpO2: 97%   General: No acute distress Head:   Normocephalic/atraumatic Skin/Extremities: No rash, no edema Neurological Exam: alert and awake. No aphasia, mild dysarthria (chronic). Fund of knowledge is reduced. Attention and concentration are normal.   Cranial nerves: Pupils equal, round. Extraocular movements intact with no nystagmus. Visual fields full.  No facial asymmetry.  Motor: Bulk and tone normal, muscle strength 5/5 throughout with no pronator drift.  Finger to nose testing intact.  Gait not tested, sitting on wheelchair.   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 He has had a good response to switching to Lamotrigine monotherapy, previously requiring increasing doses to '250mg'$  BID, however family self-reduced dose to '200mg'$  daily in 2021 and he is overall doing well with one brief seizure in the past year, continue current dose. Continue 24/7 care. Dementia is worsening. Follow-up in 1 year, call for any changes.    Thank you for allowing me to participate in his care.  Please do not hesitate to call for any questions or concerns.    Ellouise Newer, M.D.   CC: Threasa Alpha, FNP

## 2022-06-04 NOTE — Patient Instructions (Signed)
Always good to see you. Continue Lamotrigine '200mg'$  daily. Continue 24/7 care. Follow-up in 1 year, call for any changes.   Seizure Precautions: 1. If medication has been prescribed for you to prevent seizures, take it exactly as directed.  Do not stop taking the medicine without talking to your doctor first, even if you have not had a seizure in a long time.   2. Avoid activities in which a seizure would cause danger to yourself or to others.  Don't operate dangerous machinery, swim alone, or climb in high or dangerous places, such as on ladders, roofs, or girders.  Do not drive unless your doctor says you may.  3. If you have any warning that you may have a seizure, lay down in a safe place where you can't hurt yourself.    4.  No driving for 6 months from last seizure, as per [facility].   Please refer to the following link on the [place] website for more information: [email or web address]   5.  Maintain good sleep hygiene.  6.  Contact your doctor if you have any problems that may be related to the medicine you are taking.  7.  Call 911 and bring the patient back to the ED if:        A.  The seizure lasts longer than 5 minutes.       B.  The patient doesn't awaken shortly after the seizure  C.  The patient has new problems such as difficulty seeing, speaking or moving  D.  The patient was injured during the seizure  E.  The patient has a temperature over 102 F (39C)  F.  The patient vomited and now is having trouble breathing

## 2023-05-28 ENCOUNTER — Encounter: Payer: Self-pay | Admitting: Neurology

## 2023-05-28 ENCOUNTER — Ambulatory Visit (INDEPENDENT_AMBULATORY_CARE_PROVIDER_SITE_OTHER): Payer: Medicare Other | Admitting: Neurology

## 2023-05-28 VITALS — BP 103/58 | HR 92 | Ht 64.0 in | Wt 120.0 lb

## 2023-05-28 DIAGNOSIS — F0280 Dementia in other diseases classified elsewhere without behavioral disturbance: Secondary | ICD-10-CM | POA: Diagnosis not present

## 2023-05-28 DIAGNOSIS — G40209 Localization-related (focal) (partial) symptomatic epilepsy and epileptic syndromes with complex partial seizures, not intractable, without status epilepticus: Secondary | ICD-10-CM | POA: Diagnosis not present

## 2023-05-28 MED ORDER — LAMOTRIGINE 200 MG PO TABS: ORAL_TABLET | ORAL | 4 refills | Status: DC

## 2023-05-28 NOTE — Progress Notes (Signed)
NEUROLOGY FOLLOW UP OFFICE NOTE  Nicholas Hodges [id number] [birth year]  HISTORY OF PRESENT ILLNESS: I had the pleasure of seeing Nicholas Hodges in follow-up in the neurology clinic on 05/28/2023.  The patient was last seen a year ago for seizures. He is again accompanied by his daughter Nicholas Hodges and son-in-law Nicholas Hodges who help supplement the history today.  Records and images were personally reviewed where available.  Since his last visit, they report one seizure, again occurring in the month of March. He had a seizure in bed March 2024 with jerking for a few minutes followed by fatigue. No tongue bite or incontinence. He is on Lamotrigine 200mg  daily without side effects. He is hard of hearing. He denies any headaches, dizziness, focal weakness. Family holds him on both sides to ambulate. He feeds himself. He sometimes thinks his daughter is his sister but does not forget Nicholas Hodges's name.    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 He is total care at baseline, family helps him with dressing and bathing, and started noticing that he would be leaning forward and was more wobbly recently. His daughter bought him new shoes, and when she tried to put it on last 8/26, noticed that he was not reacting when his feet were touched, he would usually be very ticklish. His son-in-law put him in the tub for a bath, and he reported that he could not feel patchy areas on his left leg. Family also noticed his speech was slurred and he was more disoriented (could not remember her name). He was brought to the ER, and daughter reports by that time, he was back to baseline, not wanting staff to touch his feet. Family stays with him 24/7 and had not witnessed any  seizure activity. In the ER, he was noted to have a supratherapeutic Dilantin level of 32. Records in the ER indicate he was on 300mg  TID, but his wife and daughter today confirm repeatedly that he has been on 100mg  TID Dilantin for many years with no change in dose, no new medications added. His wife gives his medications. CBC, BMP were unremarkable except for mildly low sodium of 132. Urinalysis negative. I personally reviewed head CT without contrast which did not show any acute changes, there was severe atrophy with lateral ventriculomegaly, right frontal approach ventriculostomy catheter with tip in the temporal horn of the right lateral ventricle. 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 Family was instructed to hold Dilantin until Neurology follow-up.   His family reports he has been doing well since hospital discharge, with no further slurred speech. There is some delay in answering questions, but he appears to be able to answer appropriately when asked about dizziness ("some, not all the time"). His vision is "sometimes blurry, sometimes clear," and states it is blurry today. He denies any neck/back pain, no paresthesias. His wife reports that he continues to have seizures every 2 months or so, he would start staring, get stiff with twitching, with head turn to the left. Seizures last 1-2 minutes, he is drowsy after. He wears adult diapers but family reports he has incontinence with the seizures. This year he has had around 4 seizures. His wife  denies any other prior AEDs. They started noticing memory changes for the past several years, he would forget little things, or put shoes on the wrong foot. Family now puts on his clothes and bathes him. He usually repeats himself. They report sleep and appetite are good. His older sister had dementia.   Update 05/14/2017: Over the past 1.5 weeks, he had a significant change in mental  status. They saw his PCP yesterday who felt that he may be overmedicated due to Dilantin and Lamotrigine combination. Over the past 1.5 weeks, his family reports that he just wants to lay in bed and does not want to do anything anymore. He used to be able to feed himself, but now his wife feeds him. He has to be carried to the commode. They have noticed a decrease in his urine output, previously they had to change his sheets a lot, but now the adult diapers are less wet. They have not noticed any change in urine color or smell. His BP has been running low. No fever per family. He also states he is dizzy when family tries to stand or sit him up. They have not witnessed any of his typical seizures with body stiffening. He has been hallucinating, speaking very loudly looking at the ceiling or asking what is on the floor. He sees a little boy in a white hat or that someone bit his head. He told his wife he was dying. He has not been complaining of any headaches, no nausea/vomiting. When he eats, appetite is good but he has more difficulties swallowing. He is noted to have some right leg jerking in the office, his daughter reports he has always done that.   Update 07/05/2017: Family was given instructions to start Lamotrigine and start tapering down Dilantin. His daughter had confirmed during his visit on 05/14/17 that he was taking Lamictal 2 tabs twice a day and Dilantin 1 cap daily. On that visit, family reported a significant change in mental status, he was unable to feed himself and needed more assistance with transfers. Family denied any seizures, but reported hallucinations. He had an EEG done in the office which did not show any electrographic seizure, there was moderate diffuse background slowing and frequent right mid-temporal epileptiform discharges. He was sent to the ER for evaluation, head CT without contrast did not show any acute changes, there were post-surgical changes from the occipital and right  temporal craniotomies with areas of resection and encephalomalacia at the anterior right temporal lobe and at the posterior fossa. His Dilantin level was supratherapeutic at 46.2. I spoke to his daughter again and she then reported that they were giving the medications with instructions in reverse, Dilantin 2 caps BID and Lamictal 1 tab daily, thus causing the significant increase in Dilantin level. Dilantin was held in the ER, I instructed her to continue on discontinuing the Dilantin and start uptitrating the Lamictal. He is now on Lamictal 100mg  BID without side effects. He is much improved today, interactive, able to follow commands. His family reports that he is feeding himself and has gained weight. Since his last visit, he has had one witnessed convulsion with head deviation to the left lasting 5 minutes. It took him 30-45 minutes to recover. He occasional gets dizzy with transfers, no nausea/vomiting.    Update 08/25/2017:  He was admitted to [facility] last 08/16/17 after a prolonged seizure when his wife was awoken by repeated swallowing followed by a 20-minute GTC followed by right gaze deviation and  left-sided weakness. His EEG showed right-sided slowing and right temporal sharp waves. MRI brain no changes from prior MRI with right temporal encephalomalacia, shunt in place with chronic ventriculomegaly. He was close to baseline the next day. Lamictal level was 7.8. Family was instructed to increase Lamotrigine to 200mg  BID. Aside from the prolonged seizure in January, he had one small seizure in November that lasted 5 minutes. They report that he was congested while in the hospital.    PAST MEDICAL HISTORY: Past Medical History:  Diagnosis Date   Anemia    Arthritis    "knees" (08/17/2017)   Brain tumor (benign) (HCC)    Dementia (HCC)    Epilepsy (HCC)    GERD (gastroesophageal reflux disease)    Headache    "w/seizures and probably a couple monthly without seizures" (08/17/2017)   Hyperlipidemia     Hypertension    Melanoma of nose (HCC)    Mitral valve disorder    "it leaks" (08/17/2017)   Myocarditis (HCC)    S/P VP shunt    Skull fracture (HCC)    Subacute bacterial endocarditis     MEDICATIONS: Current Outpatient Medications on File Prior to Visit  Medication Sig Dispense Refill   aspirin EC 81 MG tablet Take 81 mg by mouth daily.     lamoTRIgine (LAMICTAL) 200 MG tablet Take 1 tablet every morning 90 tablet 3   No current facility-administered medications on file prior to visit.    ALLERGIES: No Known Allergies  FAMILY HISTORY: Family History  Problem Relation Age of Onset   Aneurysm Mother    Dementia Sister    Stomach cancer Sister    Colon cancer Sister     SOCIAL HISTORY: Social History   Socioeconomic History   Marital status: Widowed    Spouse name: Not on file   Number of children: Not on file   Years of education: Not on file   Highest education level: Not on file  Occupational History   Not on file  Tobacco Use   Smoking status: Never   Smokeless tobacco: Never  Vaping Use   Vaping status: Never Used  Substance and Sexual Activity   Alcohol use: No   Drug use: No   Sexual activity: Not Currently  Other Topics Concern   Not on file  Social History Narrative   Pt and wife live with daughter and her family (spouse and 2 adult children) in 2 story home   Highest level of education; 8th grade   Retired Optician, 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   Has 2 children   Social Determinants of Corporate investment banker Strain: Not on file  Food Insecurity: Not on file  Transportation Needs: Not on file  Physical Activity: Not on file  Stress: Not on file  Social Connections: Not on file  Intimate Partner Violence: Not on file     PHYSICAL EXAM: Vitals:   05/28/23 1441  BP: (!) 103/58  Pulse: 92  SpO2: 93%   General: No acute distress, sitting on wheelchair Head:  Normocephalic/atraumatic Skin/Extremities: No rash, no edema Neurological  Exam: alert and awake. No aphasia or dysarthria. Fund of knowledge is reduced. Attention and concentration are reduced.  He introduces [place] as his daughter but gives his wife's name as her name. Cranial nerves: Pupils equal, round. Extraocular movements intact with no nystagmus. Visual fields full. Hard of hearing.  No facial asymmetry.  Motor: Bulk and tone normal, muscle strength 5/5 throughout with no pronator drift.  Finger to nose testing intact.  Gait not tested. No tremors.    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 He has been doing overall well on Lamotrigine monotherapy, family self-reduced dose to 200mg  daily in 2021 and he has had one seizure a year, last seizure 10/2023. Continue Lamotrigine 200mg  daily. Continue 24/7 care. Follow-up in 1 year, call for any changes.      Thank you for allowing me to participate in his care.  Please do not hesitate to call for any questions or concerns.    Patrcia Dolly, M.D.   CC: Franco Nones, FNP

## 2023-05-28 NOTE — Patient Instructions (Signed)
Always a pleasure to see you. Continue Lamotrigine 200mg  daily. Continue 24/7 care. Follow-up in 1 year, call for any changes   Seizure Precautions: 1. If medication has been prescribed for you to prevent seizures, take it exactly as directed.  Do not stop taking the medicine without talking to your doctor first, even if you have not had a seizure in a long time.   2. Avoid activities in which a seizure would cause danger to yourself or to others.  Don't operate dangerous machinery, swim alone, or climb in high or dangerous places, such as on ladders, roofs, or girders.  Do not drive unless your doctor says you may.  3. If you have any warning that you may have a seizure, lay down in a safe place where you can't hurt yourself.    4.  No driving for 6 months from last seizure, as per [facility].   Please refer to the following link on the [employer]ile[employer] of [employer]'s website for more information: http://www.epilepsyfoundation.org/answerplace/Social/driving/drivingu.cfm   5.  Maintain good sleep hygiene.  6.  Contact your doctor if you have any problems that may be related to the medicine you are taking.  7.  Call 911 and bring the patient back to the ED if:        A.  The seizure lasts longer than 5 minutes.       B.  The patient doesn't awaken shortly after the seizure  C.  The patient has new problems such as difficulty seeing, speaking or moving  D.  The patient was injured during the seizure  E.  The patient has a temperature over 102 F (39C)  F.  The patient vomited and now is having trouble breathing

## 2024-05-29 ENCOUNTER — Encounter: Payer: Self-pay | Admitting: Neurology

## 2024-05-29 ENCOUNTER — Ambulatory Visit (INDEPENDENT_AMBULATORY_CARE_PROVIDER_SITE_OTHER): Payer: Self-pay | Admitting: Neurology

## 2024-05-29 VITALS — BP 111/70 | HR 78 | Ht 63.0 in | Wt 120.0 lb

## 2024-05-29 DIAGNOSIS — F0280 Dementia in other diseases classified elsewhere without behavioral disturbance: Secondary | ICD-10-CM | POA: Diagnosis not present

## 2024-05-29 DIAGNOSIS — G40209 Localization-related (focal) (partial) symptomatic epilepsy and epileptic syndromes with complex partial seizures, not intractable, without status epilepticus: Secondary | ICD-10-CM

## 2024-05-29 MED ORDER — LAMOTRIGINE 200 MG PO TABS: ORAL_TABLET | ORAL | 4 refills | Status: AC

## 2024-05-29 NOTE — Progress Notes (Signed)
 NEUROLOGY FOLLOW UP OFFICE NOTE  Nicholas Hodges [id number] 1940-09-07  HISTORY OF PRESENT ILLNESS: I had the pleasure of seeing Nicholas Hodges in follow-up in the neurology clinic on 05/29/2024.  The patient was last seen a year ago for seizures. He is again accompanied by his his daughter Nicholas Hodges and son-in-law Nicholas Hodges who help supplement the history today.  Records and images were personally reviewed where available.  Since his last visit, they report that he has not had any convulsions, however he has had 5 focal seizures where he is not coherent, in his own space, in a daze with diffuse weakness. He then comes back around after several minutes. They have not seen any jerking movements. These have all occurred around times when he was sick. He is on Lamotrigine  200mg  daily. When he is on higher doses of any medications, it is like he is drunk. He is harder of hearing. He talks to himself, howls at the dogs to rile them up. He denies any headaches, dizziness, focal numbness/tingling/weakness. He always walks with assistance, he is wobbly. No falls.    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 He is total care at baseline, family helps him with dressing and bathing, and started noticing that he would be leaning forward and was more wobbly recently. His daughter bought him new shoes, and when she tried to put it on last 8/26, noticed that he was not reacting when his feet were touched, he would usually be very ticklish. His son-in-law put him in the tub for a bath, and he reported that he could not feel patchy areas on his left leg. Family also noticed his speech was slurred and he was more disoriented (could not remember her name). He was  brought to the ER, and daughter reports by that time, he was back to baseline, not wanting staff to touch his feet. Family stays with him 24/7 and had not witnessed any seizure activity. In the ER, he was noted to have a supratherapeutic Dilantin  level of 32. Records in the ER indicate he was on 300mg  TID, but his wife and daughter today confirm repeatedly that he has been on 100mg  TID Dilantin  for many years with no change in dose, no new medications added. His wife gives his medications. CBC, BMP were unremarkable except for mildly low sodium of 132. Urinalysis negative. I personally reviewed head CT without contrast which did not show any acute changes, there was severe atrophy with lateral ventriculomegaly, right frontal approach ventriculostomy catheter with tip in the temporal horn of the right lateral ventricle. 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 Family was instructed to hold Dilantin  until Neurology follow-up.   His family reports he has been doing well since hospital discharge, with no further slurred speech. There is some delay in answering questions, but he appears to be able to answer appropriately when asked about dizziness (some, not all the time). His vision is sometimes blurry, sometimes clear, and states it is blurry today. He denies any neck/back pain, no paresthesias. His wife reports that he continues to have seizures every 2 months or so, he would start staring, get stiff with twitching, with head turn to  the left. Seizures last 1-2 minutes, he is drowsy after. He wears adult diapers but family reports he has incontinence with the seizures. This year he has had around 4 seizures. His wife denies any other prior AEDs. They started noticing memory changes for the past several years, he would forget little things, or put shoes on the wrong foot. Family now puts on his clothes and bathes him. He usually repeats  himself. They report sleep and appetite are good. His older sister had dementia.   Update 05/14/2017: Over the past 1.5 weeks, he had a significant change in mental status. They saw his PCP yesterday who felt that he may be overmedicated due to Dilantin  and Lamotrigine  combination. Over the past 1.5 weeks, his family reports that he just wants to lay in bed and does not want to do anything anymore. He used to be able to feed himself, but now his wife feeds him. He has to be carried to the commode. They have noticed a decrease in his urine output, previously they had to change his sheets a lot, but now the adult diapers are less wet. They have not noticed any change in urine color or smell. His BP has been running low. No fever per family. He also states he is dizzy when family tries to stand or sit him up. They have not witnessed any of his typical seizures with body stiffening. He has been hallucinating, speaking very loudly looking at the ceiling or asking what is on the floor. He sees a little boy in a white hat or that someone bit his head. He told his wife he was dying. He has not been complaining of any headaches, no nausea/vomiting. When he eats, appetite is good but he has more difficulties swallowing. He is noted to have some right leg jerking in the office, his daughter reports he has always done that.   Update 07/05/2017: Family was given instructions to start Lamotrigine  and start tapering down Dilantin . His daughter had confirmed during his visit on 05/14/17 that he was taking Lamictal  2 tabs twice a day and Dilantin  1 cap daily. On that visit, family reported a significant change in mental status, he was unable to feed himself and needed more assistance with transfers. Family denied any seizures, but reported hallucinations. He had an EEG done in the office which did not show any electrographic seizure, there was moderate diffuse background slowing and frequent right mid-temporal epileptiform  discharges. He was sent to the ER for evaluation, head CT without contrast did not show any acute changes, there were post-surgical changes from the occipital and right temporal craniotomies with areas of resection and encephalomalacia at the anterior right temporal lobe and at the posterior fossa. His Dilantin  level was supratherapeutic at 46.2. I spoke to his daughter again and she then reported that they were giving the medications with instructions in reverse, Dilantin  2 caps BID and Lamictal  1 tab daily, thus causing the significant increase in Dilantin  level. Dilantin  was held in the ER, I instructed her to continue on discontinuing the Dilantin  and start uptitrating the Lamictal . He is now on Lamictal  100mg  BID without side effects. He is much improved today, interactive, able to follow commands. His family reports that he is feeding himself and has gained weight. Since his last visit, he has had one witnessed convulsion with head deviation to the left lasting 5 minutes. It took him 30-45 minutes to recover. He occasional gets dizzy with transfers, no nausea/vomiting.  Update 08/25/2017:  He was admitted to [facility] last 08/16/17 after a prolonged seizure when his wife was awoken by repeated swallowing followed by a 20-minute GTC followed by right gaze deviation and left-sided weakness. His EEG showed right-sided slowing and right temporal sharp waves. MRI brain no changes from prior MRI with right temporal encephalomalacia, shunt in place with chronic ventriculomegaly. He was close to baseline the next day. Lamictal  level was 7.8. Family was instructed to increase Lamotrigine  to 200mg  BID. Aside from the prolonged seizure in January, he had one small seizure in November that lasted 5 minutes. They report that he was congested while in the hospital.   PAST MEDICAL HISTORY: Past Medical History:  Diagnosis Date   Anemia    Arthritis    knees (08/17/2017)   Brain tumor (benign) (HCC)    Dementia (HCC)     Epilepsy (HCC)    GERD (gastroesophageal reflux disease)    Headache    w/seizures and probably a couple monthly without seizures (08/17/2017)   Hyperlipidemia    Hypertension    Melanoma of nose (HCC)    Mitral valve disorder    it leaks (08/17/2017)   Myocarditis (HCC)    S/P VP shunt    Skull fracture (HCC)    Subacute bacterial endocarditis     MEDICATIONS: Current Outpatient Medications on File Prior to Visit  Medication Sig Dispense Refill   aspirin  EC 81 MG tablet Take 81 mg by mouth daily.     lamoTRIgine  (LAMICTAL ) 200 MG tablet Take 1 tablet every morning 90 tablet 4   No current facility-administered medications on file prior to visit.    ALLERGIES: No Known Allergies  FAMILY HISTORY: Family History  Problem Relation Age of Onset   Aneurysm Mother    Dementia Sister    Stomach cancer Sister    Colon cancer Sister     SOCIAL HISTORY: Social History   Socioeconomic History   Marital status: Widowed    Spouse name: Not on file   Number of children: Not on file   Years of education: Not on file   Highest education level: Not on file  Occupational History   Not on file  Tobacco Use   Smoking status: Never   Smokeless tobacco: Never  Vaping Use   Vaping status: Never Used  Substance and Sexual Activity   Alcohol use: No   Drug use: No   Sexual activity: Not Currently  Other Topics Concern   Not on file  Social History Narrative   Pt and wife live with daughter and her family (spouse and 2 adult children) in 2 story home   Highest level of education; 8th grade   Retired Optician, 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   Has 2 children   Social Drivers of Corporate investment banker Strain: Not on file  Food Insecurity: Not on file  Transportation Needs: Not on file  Physical Activity: Not on file  Stress: Not on file  Social Connections: Not on file  Intimate Partner Violence: Not on file     PHYSICAL EXAM: Vitals:   05/29/24 1502  BP: 111/70   Pulse: 78  SpO2: 95%   General: No acute distress, sitting on wheelchair Head:  Normocephalic/atraumatic Skin/Extremities: No rash, no edema Neurological Exam: alert and awake. No aphasia, mild dysarthria (chronic). Fund of knowledge is reduced. Attention and concentration are normal.   Cranial nerves: Pupils equal, round. Dysconjugate gaze. Extraocular movements intact with no nystagmus. Visual fields full.  No facial asymmetry. Hard of hearing.  Motor: Bulk and tone normal, muscle strength 5/5 throughout with no pronator drift.   Finger to nose testing intact.  Gait not tested. No tremors.    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 He has been on Lamotrigine  monotherapy overall doing well on Lamotrigine  200mg  daily since 2021. Over the past year, he has had an increase in focal seizures all occurring in the setting of illness. Family is hesitant to increase maintenance dose but agreeable to increasing dose by 1/2 tablet every night during times he is sick. Continue 24/7 care. Follow-up in 1 year, call for any changes.    Thank you for allowing me to participate in his care.  Please do not hesitate to call for any questions or concerns.    Darice Shivers, M.D.   CC: Channing Schaffer, FNP

## 2024-05-29 NOTE — Patient Instructions (Signed)
 Good to see you!  Continue Lamotrigine  200mg : take 1 tablet every morning. You can give an additional 1/2 tablet at night during times he is sick to provide additional cushion for seizures  2. Follow-up in 1 year, call for any changes   Seizure Precautions: 1. If medication has been prescribed for you to prevent seizures, take it exactly as directed.  Do not stop taking the medicine without talking to your doctor first, even if you have not had a seizure in a long time.   2. Avoid activities in which a seizure would cause danger to yourself or to others.  Don't operate dangerous machinery, swim alone, or climb in high or dangerous places, such as on ladders, roofs, or girders.  Do not drive unless your doctor says you may.  3. If you have any warning that you may have a seizure, lay down in a safe place where you can't hurt yourself.    4.  No driving for 6 months from last seizure, as per [place]  state law.   Please refer to the following link on the [employer]'s website for more information: http://www.epilepsyfoundation.org/answerplace/Social/driving/drivingu.cfm   5.  Maintain good sleep hygiene.  6.  Contact your doctor if you have any problems that may be related to the medicine you are taking.  7.  Call 911 and bring the patient back to the ED if:        A.  The seizure lasts longer than 5 minutes.       B.  The patient doesn't awaken shortly after the seizure  C.  The patient has new problems such as difficulty seeing, speaking or moving  D.  The patient was injured during the seizure  E.  The patient has a temperature over 102 F (39C)  F.  The patient vomited and now is having trouble breathing

## 2024-06-27 ENCOUNTER — Encounter: Payer: Self-pay | Admitting: Neurology

## 2025-05-29 ENCOUNTER — Ambulatory Visit: Admitting: Neurology
